# Patient Record
Sex: Male | Born: 1935 | Race: White | Hispanic: No | Marital: Married | Smoking: Former smoker
Health system: Southern US, Community
[De-identification: ages and names within clinical notes are randomized; demographics above are authoritative.]

## PROBLEM LIST (undated history)

## (undated) DIAGNOSIS — K648 Other hemorrhoids: Secondary | ICD-10-CM

## (undated) DIAGNOSIS — I10 Essential (primary) hypertension: Secondary | ICD-10-CM

## (undated) DIAGNOSIS — I2510 Atherosclerotic heart disease of native coronary artery without angina pectoris: Secondary | ICD-10-CM

## (undated) DIAGNOSIS — N189 Chronic kidney disease, unspecified: Secondary | ICD-10-CM

## (undated) DIAGNOSIS — J449 Chronic obstructive pulmonary disease, unspecified: Secondary | ICD-10-CM

## (undated) DIAGNOSIS — M1990 Unspecified osteoarthritis, unspecified site: Secondary | ICD-10-CM

## (undated) DIAGNOSIS — K5790 Diverticulosis of intestine, part unspecified, without perforation or abscess without bleeding: Secondary | ICD-10-CM

## (undated) DIAGNOSIS — N200 Calculus of kidney: Secondary | ICD-10-CM

## (undated) DIAGNOSIS — E785 Hyperlipidemia, unspecified: Secondary | ICD-10-CM

## (undated) HISTORY — DX: Unspecified osteoarthritis, unspecified site: M19.90

## (undated) HISTORY — PX: APPENDECTOMY: SHX54

## (undated) HISTORY — PX: TONSILLECTOMY AND ADENOIDECTOMY: SUR1326

## (undated) HISTORY — PX: ROTATOR CUFF REPAIR: SHX139

## (undated) HISTORY — PX: LEG SURGERY: SHX1003

## (undated) HISTORY — DX: Diverticulosis of intestine, part unspecified, without perforation or abscess without bleeding: K57.90

## (undated) HISTORY — DX: Atherosclerotic heart disease of native coronary artery without angina pectoris: I25.10

## (undated) HISTORY — DX: Other hemorrhoids: K64.8

## (undated) HISTORY — PX: INGUINAL HERNIA REPAIR: SUR1180

## (undated) HISTORY — PX: CHOLECYSTECTOMY: SHX55

## (undated) HISTORY — DX: Essential (primary) hypertension: I10

## (undated) HISTORY — PX: COLONOSCOPY: SHX174

## (undated) HISTORY — PX: TOTAL KNEE ARTHROPLASTY: SHX125

## (undated) HISTORY — DX: Hyperlipidemia, unspecified: E78.5

## (undated) HISTORY — PX: LUMBAR LAMINECTOMY: SHX95

## (undated) HISTORY — DX: Chronic obstructive pulmonary disease, unspecified: J44.9

## (undated) HISTORY — PX: ANKLE FUSION: SHX881

## (undated) HISTORY — DX: Calculus of kidney: N20.0

---

## 1999-03-14 ENCOUNTER — Ambulatory Visit (HOSPITAL_COMMUNITY): Admission: RE | Admit: 1999-03-14 | Discharge: 1999-03-14 | Payer: Self-pay | Admitting: Internal Medicine

## 1999-09-01 HISTORY — PX: CORONARY ARTERY BYPASS GRAFT: SHX141

## 2000-05-12 ENCOUNTER — Encounter: Admission: RE | Admit: 2000-05-12 | Discharge: 2000-05-12 | Payer: Self-pay | Admitting: Orthopedic Surgery

## 2000-05-12 ENCOUNTER — Encounter: Payer: Self-pay | Admitting: Orthopedic Surgery

## 2000-05-13 ENCOUNTER — Ambulatory Visit (HOSPITAL_BASED_OUTPATIENT_CLINIC_OR_DEPARTMENT_OTHER): Admission: RE | Admit: 2000-05-13 | Discharge: 2000-05-13 | Payer: Self-pay | Admitting: Orthopedic Surgery

## 2000-05-25 ENCOUNTER — Encounter: Admission: RE | Admit: 2000-05-25 | Discharge: 2000-06-14 | Payer: Self-pay | Admitting: Orthopedic Surgery

## 2004-10-06 ENCOUNTER — Ambulatory Visit: Payer: Self-pay | Admitting: Internal Medicine

## 2004-10-21 ENCOUNTER — Encounter: Admission: RE | Admit: 2004-10-21 | Discharge: 2004-10-21 | Payer: Self-pay | Admitting: Orthopedic Surgery

## 2004-10-22 ENCOUNTER — Ambulatory Visit (HOSPITAL_COMMUNITY): Admission: RE | Admit: 2004-10-22 | Discharge: 2004-10-22 | Payer: Self-pay | Admitting: Orthopedic Surgery

## 2004-10-22 ENCOUNTER — Ambulatory Visit (HOSPITAL_BASED_OUTPATIENT_CLINIC_OR_DEPARTMENT_OTHER): Admission: RE | Admit: 2004-10-22 | Discharge: 2004-10-22 | Payer: Self-pay | Admitting: Orthopedic Surgery

## 2004-11-03 ENCOUNTER — Encounter: Admission: RE | Admit: 2004-11-03 | Discharge: 2005-02-01 | Payer: Self-pay | Admitting: Orthopedic Surgery

## 2005-02-18 ENCOUNTER — Ambulatory Visit: Payer: Self-pay | Admitting: Internal Medicine

## 2005-03-06 ENCOUNTER — Ambulatory Visit: Payer: Self-pay | Admitting: Internal Medicine

## 2005-03-06 ENCOUNTER — Encounter (INDEPENDENT_AMBULATORY_CARE_PROVIDER_SITE_OTHER): Payer: Self-pay | Admitting: Specialist

## 2005-12-22 ENCOUNTER — Encounter: Admission: RE | Admit: 2005-12-22 | Discharge: 2006-03-22 | Payer: Self-pay | Admitting: Internal Medicine

## 2006-09-20 ENCOUNTER — Encounter: Admission: RE | Admit: 2006-09-20 | Discharge: 2006-10-19 | Payer: Self-pay | Admitting: Neurosurgery

## 2006-10-20 ENCOUNTER — Encounter: Admission: RE | Admit: 2006-10-20 | Discharge: 2006-11-10 | Payer: Self-pay | Admitting: Neurosurgery

## 2009-10-21 ENCOUNTER — Ambulatory Visit (HOSPITAL_COMMUNITY): Admission: RE | Admit: 2009-10-21 | Discharge: 2009-10-21 | Payer: Self-pay | Admitting: Urology

## 2010-02-12 ENCOUNTER — Encounter (INDEPENDENT_AMBULATORY_CARE_PROVIDER_SITE_OTHER): Payer: Self-pay | Admitting: *Deleted

## 2010-04-17 ENCOUNTER — Encounter: Admission: RE | Admit: 2010-04-17 | Discharge: 2010-04-17 | Payer: Self-pay | Admitting: Orthopedic Surgery

## 2010-04-28 ENCOUNTER — Encounter: Admission: RE | Admit: 2010-04-28 | Discharge: 2010-04-28 | Payer: Self-pay | Admitting: Orthopedic Surgery

## 2010-09-21 ENCOUNTER — Encounter: Payer: Self-pay | Admitting: Orthopedic Surgery

## 2010-10-02 ENCOUNTER — Other Ambulatory Visit: Payer: Self-pay | Admitting: Urology

## 2010-10-02 DIAGNOSIS — N281 Cyst of kidney, acquired: Secondary | ICD-10-CM

## 2010-10-02 NOTE — Letter (Signed)
Summary: Colonoscopy Date Change Letter  [facility]  [address] [place], [place] [address]   Phone: [phone]  Fax: [phone]      February 12, 2010 MRN: [id number]   Kristopher Ayers [address] [place], [place]  [address]   Dear Mr. Cordoba,   Previously you were recommended to have a repeat colonoscopy around this time. Your chart was recently reviewed by Dr. Hedwig Morton. Juanda Chance of  Gastroenterology. Follow up colonoscopy is now recommended in July 2013. This revised recommendation is based on current, nationally recognized guidelines for colorectal cancer screening and polyp surveillance. These guidelines are endorsed by the American Cancer Society, [employer] on Colorectal Cancer as well as numerous other major medical organizations.  Please understand that our recommendation assumes that you do not have any new symptoms such as bleeding, a change in bowel habits, anemia, or significant abdominal discomfort. If you do have any concerning GI symptoms or want to discuss the guideline recommendations, please call to arrange an office visit at your earliest convenience. Otherwise we will keep you in our reminder system and contact you 1-2 months prior to the date listed above to schedule your next colonoscopy.  Thank you,  Hedwig Morton. Juanda Chance, M.D.  [facility] [phone]

## 2010-11-06 ENCOUNTER — Inpatient Hospital Stay (HOSPITAL_COMMUNITY): Admission: RE | Admit: 2010-11-06 | Payer: Self-pay | Source: Ambulatory Visit

## 2010-11-06 ENCOUNTER — Ambulatory Visit (HOSPITAL_COMMUNITY)
Admission: RE | Admit: 2010-11-06 | Discharge: 2010-11-06 | Disposition: A | Discharge status: Home / Self Care | Payer: Medicare Other | Source: Ambulatory Visit | Attending: Urology | Admitting: Urology

## 2010-11-06 DIAGNOSIS — Q618 Other cystic kidney diseases: Secondary | ICD-10-CM | POA: Insufficient documentation

## 2010-11-06 DIAGNOSIS — N289 Disorder of kidney and ureter, unspecified: Secondary | ICD-10-CM | POA: Insufficient documentation

## 2010-11-06 MED ORDER — GADOBENATE DIMEGLUMINE 529 MG/ML IV SOLN
20.0000 mL | Freq: Once | INTRAVENOUS | Status: AC | PRN
  Administered 2010-11-06: 20 mL via INTRAVENOUS

## 2011-01-16 NOTE — Op Note (Signed)
[place]  Patient:    Kristopher Ayers, Kristopher Ayers                     MRN: [id number] Proc. Date: 05/13/00 Adm. Date:  [id number] Attending:  Colbert Ewing                           Operative Report  PREOPERATIVE DIAGNOSES:  Chronic impingement right shoulder.  Chronic tearing long head biceps tendon.  Degenerative joint disease acromioclavicular joint. Labral tear with partial thickness tearing rotator cuff.  POSTOPERATIVE DIAGNOSIS:  Chronic impingement right shoulder.  Chronic tearing long head biceps tendon.  Degenerative joint disease acromioclavicular joint. Labral tear with partial thickness tearing rotator cuff without full-thickness cuff tears.  Partial tearing subscapularis tendon.  PROCEDURES:  Right shoulder examination under anesthesia, arthroscopy with debridement of labral tears, partial thickness subscapularis tear and undersurface tearing rotator cuff.  Arthroscopic acromioplasty, bursectomy, coracoacromial ligament release and distal clavicle excision.  SURGEON:  Loreta Ave, M.D.  ASSISTANT:  Arlys John D. Petrarca, P.A.-C.  ANESTHESIA:  General.  BLOOD LOSS:  Minimal.  SPECIMENS:  None.  CULTURES:  None.  COMPLICATIONS:  None.  DRESSING:  Soft compressive with sling.  PROCEDURE:  Patient was brought to the operating room, placed on the operating table in the supine position.  After adequate anesthesia had been obtained, right shoulder examined.  Full motion with good stability found.  Placed in a beach chair position on the shoulder positioner, prepped and draped in usual sterile fashion.  Three portals created standard arch shoulder portals, anterior, posterolateral.  Shoulder entered with blunt obturator, distended and inspected.  Some grade 2 changes glenohumeral joint debrided.  Extensive frayed tearing attritional type entire labrum debrided to a stable surface. No instability pattern.  Remnant of biceps  tendon long head at the top of the glenoid debrided so that it would not produce mechanical symptoms.  Partial thickness tearing subscap and undersurface of cuff all debrided to a stable surface.  All structures thoroughly assessed.  No full-thickness tears of cuff.  Cannula was redirected subacromially.  Typical findings of impingement. Type 2-3 acromion anteriorly.  Bursa resected, cuff debrided and assessed.  No full-thickness tears.  Acromioplasty converted to a type I acromion with shaver and high-speed bur.  Distal clavicle, which also contributed to impingement and had grade 4 changes was resected over the lateral cm. Adequacy of decompression, clavicle excision confirmed viewing from all portals.  Instruments and fluid removed.  Portals, shoulder and bursa injected with Marcaine.  Portals closed with 4-0 nylon.  Sterile compressive dressing applied with sling.  Anesthesia reversed, brought to recovery room.  Tolerated surgery well, no complications. DD:  05/13/00 TD:  05/14/00 Job: [id number]

## 2011-01-16 NOTE — Op Note (Signed)
NAME:  Kristopher Ayers, NIEMAN NO.:  [id number]   MEDICAL RECORD NO.:  [id number]          PATIENT TYPE:  AMB   LOCATION:  DSC                          FACILITY:  [facility]   PHYSICIAN:  Loreta Ave, M.D. DATE OF BIRTH:  13-Nov-1925   DATE OF PROCEDURE:  10/22/2004  DATE OF DISCHARGE:                                 OPERATIVE REPORT   PREOPERATIVE DIAGNOSIS:  Right shoulder traumatic rotator cuff tear,  interval tearing between supra and infraspinatus.  Status post previous  subacromial decompression.  Also degenerative joint disease and labral tear  interarticular right shoulder glenohumeral joint.   POSTOPERATIVE DIAGNOSIS:  Right shoulder traumatic rotator cuff tear,  interval tearing between supra and infraspinatus.  Status post previous  subacromial decompression.  Also degenerative joint disease and labral tear  interarticular right shoulder glenohumeral joint with extensive grade 3 and  4 chondral changes glenohumeral joint and circumferential labral tearing.   PROCEDURE:  1.  Right shoulder exam under anesthesia.  2.  Arthroscopy.  3.  Debridement of labrum and glenohumeral joint with chondroplasty removal      loose bodies.  4.  Debridement of rotator cuff from below.  5.  Assessment of the decompression.  6.  Open repair of interval tear with a running Ethibond suture.   SURGEON:  Loreta Ave, M.D.   ASSISTANT:  Zonia Kief, P.A.   ANESTHESIA:  General.   BLOOD LOSS:  Minimal.   SPECIMENS:  None.   CULTURES:  None.   COMPLICATIONS:  None.   DRESSINGS:  Soft compressive with shoulder immobilizer.   PROCEDURE:  The patient was brought to the operating room, placed on the  operative table int he supine position.  After adequate anesthesia had been  obtained, placed in a beach chair position and the shoulder positioned,  prepped and draped in the usual sterile fashion.  Three portals created.  Anterior, posterior and lateral. Arthroscope  introduced and the joint was  distended and inspected.  Marked grade 3 and 4 changes glenohumeral joint  with numeral chondral loose bodies debrided.  Bone-on-bone over about half  the joint both glenoid and humerus, degenerative in nature.  Circumferential  tearing labrum debrided back to a stable surface.  Chronic ruptured long  head biceps tendon which was absent.  Marked partial tearing undersurface  rotator cuff infra and supraspinatus all debrided.  Although you could  palpate the interval tear between the infra and supraspinatus with a probe  from above, there was not a rent through the capsule below.  After thorough  debridement of glenohumeral joint, the cannula redirected subacromially.  Adequate bony decompression confirmed.  Roofing on the top of the entire  cuff all was debrided.  Interval tear going just about all the way out to  the lateral margin between the infra and supraspinatus, not through the  capsule.  After confirming adequate decompression, doing a bursectomy and  debriding the cuff, the instruments and fluid were removed.  Deltoid  splitting incision laterally.  The interval tear exposed, roughened to good  bleeding tissue and then closed with a running Ethibond #  2 suture x2.  Next,  performed side-to-side closure, ensuring good watertight closure of the cuff  and restoring the normal tension of the cuff.  Full passive motion without  any tension.  Wound irrigated.  Subcutaneous, subcuticular closure of the  incision with Vicryl and portal was closed with nylon.  A sterile  compressive dressing applied.  Splint applied.  Anesthesia reversed.  Brought to the recovery room.  Tolerated the surgery well, no complications.      DFM/MEDQ  D:  10/22/2004  T:  10/22/2004  Job:  [id number]

## 2011-12-15 ENCOUNTER — Encounter: Payer: Self-pay | Admitting: Internal Medicine

## 2011-12-30 ENCOUNTER — Encounter: Payer: Self-pay | Admitting: Internal Medicine

## 2012-02-18 ENCOUNTER — Encounter: Payer: Self-pay | Admitting: *Deleted

## 2012-03-08 ENCOUNTER — Encounter: Payer: Self-pay | Admitting: Internal Medicine

## 2012-03-08 ENCOUNTER — Ambulatory Visit (INDEPENDENT_AMBULATORY_CARE_PROVIDER_SITE_OTHER): Payer: Medicare Other | Admitting: Internal Medicine

## 2012-03-08 VITALS — BP 134/76 | HR 60 | Ht 66.0 in | Wt 210.0 lb

## 2012-03-08 DIAGNOSIS — R198 Other specified symptoms and signs involving the digestive system and abdomen: Secondary | ICD-10-CM

## 2012-03-08 DIAGNOSIS — Z1211 Encounter for screening for malignant neoplasm of colon: Secondary | ICD-10-CM

## 2012-03-08 DIAGNOSIS — R195 Other fecal abnormalities: Secondary | ICD-10-CM

## 2012-03-08 DIAGNOSIS — K5730 Diverticulosis of large intestine without perforation or abscess without bleeding: Secondary | ICD-10-CM

## 2012-03-08 MED ORDER — MOVIPREP 100 G PO SOLR: ORAL | Status: DC

## 2012-03-08 NOTE — Progress Notes (Signed)
Kristopher Ayers 1925-09-02 MRN [id number]   History of Present Illness:  This is an 78 year old white male with change in bowel habits. He has incomplete evacuation and constipation. He has not had a bowel movement for 3 days. He uses apple juice and has tried Metamucil without results. Patient had a screening colonoscopy in July 2006 which showed a polyp and diverticulosis. The pathology of the polyp was polypoid mucosa. He denies rectal bleeding or family history of colon cancer. He has a history of a renal cyst which is followed by MRI. He is due for a recall colonoscopy.   Past Medical History  Diagnosis Date  . DJD (degenerative joint disease)   . Hypertension   . Hyperlipidemia   . Kidney stone   . Diverticulosis   . Internal hemorrhoids   . COPD (chronic obstructive pulmonary disease)    Past Surgical History  Procedure Date  . Ankle fusion   . Rotator cuff repair     x 2  . Total knee arthroplasty     left  . Lumbar laminectomy   . Coronary artery bypass graft   . Tonsillectomy and adenoidectomy   . Inguinal hernia repair   . Leg surgery     reports that he has quit smoking. He has never used smokeless tobacco. He reports that he does not drink alcohol or use illicit drugs. family history is negative for Colon cancer. Allergies  Allergen Reactions  . Morphine And Related         Review of Systems: Denies heartburn, dysphagia, odynophagia chest pain  The remainder of the 10 point ROS is negative except as outlined in H&P   Physical Exam: General appearance  Well developed, in no distress. Eyes- non icteric. HEENT nontraumatic, normocephalic. Mouth no lesions, tongue papillated, no cheilosis. Neck supple without adenopathy, thyroid not enlarged, no carotid bruits, no JVD. Lungs Clear to auscultation bilaterally. Cor normal S1, normal S2, regular rhythm, no murmur,  quiet precordium. Abdomen: Soft relaxed with normal active bowel sounds. No distention. Liver  edge at costal margin. No bruit, no palpable mass. Rectal: Slightly decreased rectal sphincter tone. Moderate amount of soft mushy Hemoccult negative stool. Extremities 2+ pedal edema. Skin no lesions. Neurological alert and oriented x 3. Psychological normal mood and affect.  Assessment and Plan:  Problem #1 Recent change in bowel habits in an 78 year old gentleman who is otherwise in good health. He has mobility problems due to swelling of his feet. He has a history of diverticulosis and his constipation may be due to progressive diverticular disease, decreased mobility and some medications; specifically his antihypertensive medications. He is Hemoccult negative. We will start him on Senokot 1-2 tablets at bedtime and I advised him to continue a high fiber diet. We will schedule a colonoscopy to rule out colon obstruction.   03/08/2012 Lina Sar

## 2012-03-08 NOTE — Patient Instructions (Addendum)
You have been scheduled for a colonoscopy with propofol. Please follow written instructions given to you at your visit today.  Please pick up your prep kit at the pharmacy within the next 1-3 days. Please make certain to bring all inhalers that you use with you on the day of your procedure. Please purchase Senokot over the counter. Take 1-2 tablets every bedtime. CC: Dr Synetta Fail

## 2012-03-29 ENCOUNTER — Encounter: Payer: Self-pay | Admitting: *Deleted

## 2012-03-29 NOTE — Telephone Encounter (Signed)
Entered in error

## 2012-03-30 ENCOUNTER — Encounter: Payer: Self-pay | Admitting: Internal Medicine

## 2012-03-30 ENCOUNTER — Ambulatory Visit (AMBULATORY_SURGERY_CENTER): Payer: Medicare Other | Admitting: Internal Medicine

## 2012-03-30 VITALS — BP 137/65 | HR 57 | Temp 98.7°F | Resp 15 | Ht 66.0 in | Wt 210.0 lb

## 2012-03-30 DIAGNOSIS — Z1211 Encounter for screening for malignant neoplasm of colon: Secondary | ICD-10-CM

## 2012-03-30 DIAGNOSIS — D126 Benign neoplasm of colon, unspecified: Secondary | ICD-10-CM

## 2012-03-30 MED ORDER — SODIUM CHLORIDE 0.9 % IV SOLN: 500.0000 mL | INTRAVENOUS | Status: DC

## 2012-03-30 NOTE — Op Note (Signed)
[facility] [address] [place], [place]  [address]  COLONOSCOPY PROCEDURE REPORT  PATIENT:  Kristopher, Ayers  MR#:  [id number] BIRTHDATE:  06-04-1926, 86 yrs. old  GENDER:  male ENDOSCOPIST:  Hedwig Morton. Juanda Chance, MD REF. BY:  Synetta Fail, M.D. PROCEDURE DATE:  03/30/2012 PROCEDURE:  Colonoscopy with biopsy ASA CLASS:  Class II INDICATIONS:  change in bowel habits last colon 2006 MEDICATIONS:   MAC sedation, administered by CRNA, propofol (Diprivan) 100 mg  DESCRIPTION OF PROCEDURE:   After the risks and benefits and of the procedure were explained, informed consent was obtained. Digital rectal exam was performed and revealed no rectal masses. The LB CF-[id number] endoscope was introduced through the anus and advanced to the cecum, which was identified by both the appendix and ileocecal valve.  The quality of the prep was Moviprep fair.  The instrument was then slowly withdrawn as the colon was fully examined. <<PROCEDUREIMAGES>>  FINDINGS:  Mild diverticulosis was found throughout the colon (see image6, image7, and image8).  A diminutive polyp was found. at 20 cm 5 mm polyp The polyp was removed using cold biopsy forceps (see image8).  This was otherwise a normal examination of the colon (see image1, image2, image3, image4, and image5).   Retroflexion was not performed.  The scope was then withdrawn from the patient and the procedure completed.  COMPLICATIONS:  None ENDOSCOPIC IMPRESSION: 1) Mild diverticulosis throughout the colon 2) Diminutive polyp 3) Otherwise normal examination change in bowl habits likely related to life style. will increase fiber, exercise,, may use OTC laxatives RECOMMENDATIONS: Colace 100mg  qd Miralax 9 mg 3x/week prn  REPEAT EXAM:  In 0 year(s) for.  ______________________________ Hedwig Morton. Juanda Chance, MD  CC:  n. eSIGNED:   Hedwig Morton. Glora Hulgan at 03/30/2012 04:40 PM  Faylene Kurtz, [phone]

## 2012-03-30 NOTE — Progress Notes (Signed)
Patient did not experience any of the following events: a burn prior to discharge; a fall within the facility; wrong site/side/patient/procedure/implant event; or a hospital transfer or hospital admission upon discharge from the facility. (G8907) Patient did not have preoperative order for IV antibiotic SSI prophylaxis. (G8918)  

## 2012-03-30 NOTE — Patient Instructions (Addendum)

## 2012-03-31 ENCOUNTER — Telehealth: Payer: Self-pay

## 2012-03-31 NOTE — Telephone Encounter (Signed)
Left message on answering machine. 

## 2012-04-05 ENCOUNTER — Encounter: Payer: Self-pay | Admitting: Internal Medicine

## 2012-08-16 ENCOUNTER — Other Ambulatory Visit (HOSPITAL_COMMUNITY): Payer: Self-pay | Admitting: Cardiovascular Disease

## 2012-08-16 DIAGNOSIS — I739 Peripheral vascular disease, unspecified: Secondary | ICD-10-CM

## 2013-06-02 ENCOUNTER — Encounter (HOSPITAL_COMMUNITY): Payer: Medicare Other

## 2014-04-10 ENCOUNTER — Encounter: Payer: Self-pay | Admitting: Internal Medicine

## 2015-09-04 DIAGNOSIS — R0602 Shortness of breath: Secondary | ICD-10-CM | POA: Diagnosis not present

## 2015-09-04 DIAGNOSIS — I2510 Atherosclerotic heart disease of native coronary artery without angina pectoris: Secondary | ICD-10-CM | POA: Diagnosis not present

## 2015-09-10 DIAGNOSIS — I2510 Atherosclerotic heart disease of native coronary artery without angina pectoris: Secondary | ICD-10-CM | POA: Diagnosis not present

## 2015-09-10 DIAGNOSIS — N183 Chronic kidney disease, stage 3 (moderate): Secondary | ICD-10-CM | POA: Diagnosis not present

## 2015-09-10 DIAGNOSIS — I10 Essential (primary) hypertension: Secondary | ICD-10-CM | POA: Diagnosis not present

## 2015-09-10 DIAGNOSIS — E785 Hyperlipidemia, unspecified: Secondary | ICD-10-CM | POA: Diagnosis not present

## 2015-09-10 DIAGNOSIS — R9439 Abnormal result of other cardiovascular function study: Secondary | ICD-10-CM | POA: Diagnosis not present

## 2015-09-10 DIAGNOSIS — R0609 Other forms of dyspnea: Secondary | ICD-10-CM | POA: Diagnosis not present

## 2015-09-17 DIAGNOSIS — R9439 Abnormal result of other cardiovascular function study: Secondary | ICD-10-CM | POA: Diagnosis not present

## 2015-09-17 DIAGNOSIS — Z01818 Encounter for other preprocedural examination: Secondary | ICD-10-CM | POA: Diagnosis not present

## 2015-09-17 DIAGNOSIS — R0609 Other forms of dyspnea: Secondary | ICD-10-CM | POA: Diagnosis not present

## 2015-09-17 DIAGNOSIS — Z981 Arthrodesis status: Secondary | ICD-10-CM | POA: Diagnosis not present

## 2015-09-17 DIAGNOSIS — R0602 Shortness of breath: Secondary | ICD-10-CM | POA: Diagnosis not present

## 2015-09-17 DIAGNOSIS — R918 Other nonspecific abnormal finding of lung field: Secondary | ICD-10-CM | POA: Diagnosis not present

## 2015-09-17 DIAGNOSIS — Z01810 Encounter for preprocedural cardiovascular examination: Secondary | ICD-10-CM | POA: Diagnosis not present

## 2015-09-18 DIAGNOSIS — I2510 Atherosclerotic heart disease of native coronary artery without angina pectoris: Secondary | ICD-10-CM | POA: Diagnosis not present

## 2015-09-18 DIAGNOSIS — Z7982 Long term (current) use of aspirin: Secondary | ICD-10-CM | POA: Diagnosis not present

## 2015-09-18 DIAGNOSIS — I444 Left anterior fascicular block: Secondary | ICD-10-CM | POA: Diagnosis not present

## 2015-09-18 DIAGNOSIS — I25718 Atherosclerosis of autologous vein coronary artery bypass graft(s) with other forms of angina pectoris: Secondary | ICD-10-CM | POA: Diagnosis not present

## 2015-09-18 DIAGNOSIS — R001 Bradycardia, unspecified: Secondary | ICD-10-CM | POA: Diagnosis not present

## 2015-09-18 DIAGNOSIS — I25118 Atherosclerotic heart disease of native coronary artery with other forms of angina pectoris: Secondary | ICD-10-CM | POA: Diagnosis not present

## 2015-09-18 DIAGNOSIS — R0609 Other forms of dyspnea: Secondary | ICD-10-CM | POA: Diagnosis not present

## 2015-09-18 DIAGNOSIS — N189 Chronic kidney disease, unspecified: Secondary | ICD-10-CM | POA: Diagnosis not present

## 2015-09-18 DIAGNOSIS — J449 Chronic obstructive pulmonary disease, unspecified: Secondary | ICD-10-CM | POA: Diagnosis not present

## 2015-09-18 DIAGNOSIS — Z951 Presence of aortocoronary bypass graft: Secondary | ICD-10-CM | POA: Diagnosis not present

## 2015-09-18 DIAGNOSIS — Z79899 Other long term (current) drug therapy: Secondary | ICD-10-CM | POA: Diagnosis not present

## 2015-09-18 DIAGNOSIS — I129 Hypertensive chronic kidney disease with stage 1 through stage 4 chronic kidney disease, or unspecified chronic kidney disease: Secondary | ICD-10-CM | POA: Diagnosis not present

## 2015-09-18 DIAGNOSIS — I081 Rheumatic disorders of both mitral and tricuspid valves: Secondary | ICD-10-CM | POA: Diagnosis not present

## 2015-09-18 DIAGNOSIS — I440 Atrioventricular block, first degree: Secondary | ICD-10-CM | POA: Diagnosis not present

## 2015-09-18 DIAGNOSIS — I358 Other nonrheumatic aortic valve disorders: Secondary | ICD-10-CM | POA: Diagnosis not present

## 2015-09-18 DIAGNOSIS — R931 Abnormal findings on diagnostic imaging of heart and coronary circulation: Secondary | ICD-10-CM | POA: Diagnosis not present

## 2015-09-18 DIAGNOSIS — I491 Atrial premature depolarization: Secondary | ICD-10-CM | POA: Diagnosis not present

## 2015-09-18 DIAGNOSIS — R0600 Dyspnea, unspecified: Secondary | ICD-10-CM | POA: Diagnosis not present

## 2015-09-18 DIAGNOSIS — I517 Cardiomegaly: Secondary | ICD-10-CM | POA: Diagnosis not present

## 2015-09-19 DIAGNOSIS — I2510 Atherosclerotic heart disease of native coronary artery without angina pectoris: Secondary | ICD-10-CM | POA: Diagnosis not present

## 2015-09-19 DIAGNOSIS — J449 Chronic obstructive pulmonary disease, unspecified: Secondary | ICD-10-CM | POA: Diagnosis not present

## 2015-10-01 DIAGNOSIS — Z029 Encounter for administrative examinations, unspecified: Secondary | ICD-10-CM | POA: Diagnosis not present

## 2015-10-19 DIAGNOSIS — I25110 Atherosclerotic heart disease of native coronary artery with unstable angina pectoris: Secondary | ICD-10-CM | POA: Diagnosis not present

## 2015-10-19 DIAGNOSIS — E785 Hyperlipidemia, unspecified: Secondary | ICD-10-CM | POA: Diagnosis not present

## 2015-10-19 DIAGNOSIS — I10 Essential (primary) hypertension: Secondary | ICD-10-CM | POA: Diagnosis not present

## 2015-10-22 DIAGNOSIS — I10 Essential (primary) hypertension: Secondary | ICD-10-CM | POA: Diagnosis not present

## 2015-10-22 DIAGNOSIS — I2510 Atherosclerotic heart disease of native coronary artery without angina pectoris: Secondary | ICD-10-CM | POA: Diagnosis not present

## 2015-10-22 DIAGNOSIS — I358 Other nonrheumatic aortic valve disorders: Secondary | ICD-10-CM | POA: Diagnosis not present

## 2015-10-28 DIAGNOSIS — H353132 Nonexudative age-related macular degeneration, bilateral, intermediate dry stage: Secondary | ICD-10-CM | POA: Diagnosis not present

## 2015-11-05 DIAGNOSIS — M19011 Primary osteoarthritis, right shoulder: Secondary | ICD-10-CM | POA: Diagnosis not present

## 2015-12-16 DIAGNOSIS — F320 Major depressive disorder, single episode, mild: Secondary | ICD-10-CM | POA: Diagnosis not present

## 2015-12-16 DIAGNOSIS — R0609 Other forms of dyspnea: Secondary | ICD-10-CM | POA: Diagnosis not present

## 2015-12-16 DIAGNOSIS — R5383 Other fatigue: Secondary | ICD-10-CM | POA: Diagnosis not present

## 2015-12-16 DIAGNOSIS — J449 Chronic obstructive pulmonary disease, unspecified: Secondary | ICD-10-CM | POA: Diagnosis not present

## 2015-12-16 DIAGNOSIS — R0602 Shortness of breath: Secondary | ICD-10-CM | POA: Diagnosis not present

## 2015-12-16 DIAGNOSIS — Z951 Presence of aortocoronary bypass graft: Secondary | ICD-10-CM | POA: Diagnosis not present

## 2015-12-16 DIAGNOSIS — M19011 Primary osteoarthritis, right shoulder: Secondary | ICD-10-CM | POA: Diagnosis not present

## 2016-01-02 DIAGNOSIS — H903 Sensorineural hearing loss, bilateral: Secondary | ICD-10-CM | POA: Diagnosis not present

## 2016-01-14 DIAGNOSIS — M19011 Primary osteoarthritis, right shoulder: Secondary | ICD-10-CM | POA: Diagnosis not present

## 2016-01-15 DIAGNOSIS — I2510 Atherosclerotic heart disease of native coronary artery without angina pectoris: Secondary | ICD-10-CM | POA: Diagnosis not present

## 2016-01-15 DIAGNOSIS — E785 Hyperlipidemia, unspecified: Secondary | ICD-10-CM | POA: Diagnosis not present

## 2016-01-15 DIAGNOSIS — I10 Essential (primary) hypertension: Secondary | ICD-10-CM | POA: Diagnosis not present

## 2016-01-15 DIAGNOSIS — R0609 Other forms of dyspnea: Secondary | ICD-10-CM | POA: Diagnosis not present

## 2016-01-28 DIAGNOSIS — M1000 Idiopathic gout, unspecified site: Secondary | ICD-10-CM | POA: Diagnosis not present

## 2016-01-28 DIAGNOSIS — H6691 Otitis media, unspecified, right ear: Secondary | ICD-10-CM | POA: Diagnosis not present

## 2016-01-30 DIAGNOSIS — H9201 Otalgia, right ear: Secondary | ICD-10-CM | POA: Diagnosis not present

## 2016-01-30 DIAGNOSIS — H66001 Acute suppurative otitis media without spontaneous rupture of ear drum, right ear: Secondary | ICD-10-CM | POA: Diagnosis not present

## 2016-01-31 ENCOUNTER — Telehealth: Payer: Self-pay | Admitting: Cardiology

## 2016-01-31 NOTE — Telephone Encounter (Signed)
Faxed request to [facility] [place] [facility] to obtain records for patient's appointment on 02/24/16 with Dr Martinique.  Faxed on 01/31/16. lp

## 2016-02-04 ENCOUNTER — Telehealth: Payer: Self-pay | Admitting: Cardiology

## 2016-02-04 NOTE — Telephone Encounter (Signed)
Received records from [facility] as requested for appointment with Dr Martinique 02/24/16.  Records given to [facility] (medical records) for Dr Doug Sou schedule on 02/24/16. lp

## 2016-02-05 DIAGNOSIS — H60311 Diffuse otitis externa, right ear: Secondary | ICD-10-CM | POA: Diagnosis not present

## 2016-02-11 DIAGNOSIS — H60501 Unspecified acute noninfective otitis externa, right ear: Secondary | ICD-10-CM | POA: Diagnosis not present

## 2016-02-11 DIAGNOSIS — H6001 Abscess of right external ear: Secondary | ICD-10-CM | POA: Diagnosis not present

## 2016-02-18 DIAGNOSIS — H6001 Abscess of right external ear: Secondary | ICD-10-CM | POA: Diagnosis not present

## 2016-02-18 DIAGNOSIS — H6000 Abscess of external ear, unspecified ear: Secondary | ICD-10-CM | POA: Diagnosis not present

## 2016-02-24 ENCOUNTER — Ambulatory Visit (INDEPENDENT_AMBULATORY_CARE_PROVIDER_SITE_OTHER): Payer: PPO | Admitting: Cardiology

## 2016-02-24 ENCOUNTER — Encounter: Payer: Self-pay | Admitting: Cardiology

## 2016-02-24 VITALS — BP 149/76 | HR 64 | Ht 66.0 in | Wt 214.6 lb

## 2016-02-24 DIAGNOSIS — I25709 Atherosclerosis of coronary artery bypass graft(s), unspecified, with unspecified angina pectoris: Secondary | ICD-10-CM

## 2016-02-24 DIAGNOSIS — E785 Hyperlipidemia, unspecified: Secondary | ICD-10-CM | POA: Diagnosis not present

## 2016-02-24 DIAGNOSIS — I2510 Atherosclerotic heart disease of native coronary artery without angina pectoris: Secondary | ICD-10-CM | POA: Insufficient documentation

## 2016-02-24 DIAGNOSIS — I10 Essential (primary) hypertension: Secondary | ICD-10-CM

## 2016-02-24 NOTE — Patient Instructions (Signed)
Continue your current therapy  We will request a copy of your cath films from [facility].  I will see you in 6 months.

## 2016-02-24 NOTE — Progress Notes (Signed)
Cardiology Office Note    Date:  02/24/2016   ID:  Kristopher Ayers, DOB Jan 13, 1926, MRN [id number]  PCP:  Kristopher Coma., MD  Cardiologist:  Peter Martinique, MD    History of Present Illness:  Kristopher Ayers is a 82 y.o. male seen at the request of Dr. Veverly Fells for cardiac evaluation. The patient is being considered for shoulder replacement surgery. He is a former patient of Dr. Baxter Hire in [facility]. He has a history of CAD and is s/p CABG in 2001 by Dr. Jerelene Redden. This included LIMA to the LAD, SVG to Ramus, and SVG to RCA. He did well until the end of 2016 when he developed progressive dyspnea on exertion. A stress test was abnormal and this led to a cardiac cath. This showed his grafts were all patent but the LCX and OM1 had significant disease and were not previously grafted. The LCx was stented with a 3.0 x 15 mm Xience stent and the OM with a 2.75 x 15 mm Xience stent. Echo at that time showed normal LV function with EF 50-55%. There was aortic valve sclerosis without stenosis. He has a history of remote tobacco use 50 years ago, HTN, and hyperlipidemia. Also family history of CAD.  Since January he did note some improvement in dyspnea but still gets SOB with activity. His activity is very limited due to chronic leg weakness and pain. He does very little walking. Vascular doppler studies have been ok. He has 3 prior back operations. He does have a lot of shoulder pain and is s/p rotator cuff surgery x 2. He denies any orthopnea, PND, edema, palpitations, or syncope.  Past Medical History  Diagnosis Date  . DJD (degenerative joint disease)   . Hypertension   . Hyperlipidemia   . Kidney stone   . Diverticulosis   . Internal hemorrhoids   . COPD (chronic obstructive pulmonary disease) ([place])   . CAD (coronary artery disease)     Past Surgical History  Procedure Laterality Date  . Ankle fusion    . Rotator cuff repair      x 2  . Total knee arthroplasty      left  . Lumbar  laminectomy      x3  . Coronary artery bypass graft    . Tonsillectomy and adenoidectomy    . Inguinal hernia repair    . Leg surgery      Current Medications:   Medication List       This list is accurate as of: 02/24/16 12:58 PM.  Always use your most recent med list.               acetaminophen 325 MG tablet  Commonly known as:  TYLENOL  Take 650 mg by mouth.     allopurinol 100 MG tablet  Commonly known as:  ZYLOPRIM  Take 100 mg by mouth daily.     ANDROGEL PUMP 20.25 MG/ACT (1.62%) Gel  Generic drug:  Testosterone     aspirin 81 MG tablet  Take 81 mg by mouth daily.     chlorthalidone 25 MG tablet  Commonly known as:  HYGROTON  Take 12.5 mg by mouth.     clopidogrel 75 MG tablet  Commonly known as:  PLAVIX  Take 75 mg by mouth.     febuxostat 40 MG tablet  Commonly known as:  ULORIC  Take 40 mg by mouth.     fluticasone furoate-vilanterol 100-25 MCG/INH Aepb  Commonly known  as:  BREO ELLIPTA     losartan 50 MG tablet  Commonly known as:  COZAAR  Takes as directed     metoprolol succinate 25 MG 24 hr tablet  Commonly known as:  TOPROL-XL  Take 25 mg by mouth.     pravastatin 40 MG tablet  Commonly known as:  PRAVACHOL  Take 40 mg by mouth.     PROAIR HFA 108 (90 Base) MCG/ACT inhaler  Generic drug:  albuterol  Uses as directed     SPIRIVA HANDIHALER 18 MCG inhalation capsule  Generic drug:  tiotropium  Uses as directed     valsartan 80 MG tablet  Commonly known as:  DIOVAN     VITAMIN D-1000 MAX ST 1000 units tablet  Generic drug:  Cholecalciferol  Take by mouth.         Allergies:   Amlodipine; Buprenorphine hcl; Diazepam; Irbesartan; Morphine and related; Hydrocodone-acetaminophen; and Nsaids   Social History   Social History  . Marital Status: Married    Spouse Name: N/A  . Number of Children: 3  . Years of Education: N/A   Occupational History  . Retired [employer]   Social History Main Topics  . Smoking status:  Former Research scientist (life sciences)  . Smokeless tobacco: Never Used  . Alcohol Use: No  . Drug Use: No  . Sexual Activity: Not Asked   Other Topics Concern  . None   Social History Narrative   Daily caffeine      Family History:  The patient's family history includes CAD in his brother; Congestive Heart Failure in his father. There is no history of Colon cancer.   ROS:   Please see the history of present illness.    ROS All other systems reviewed and are negative.   PHYSICAL EXAM:   VS:  BP 149/76 mmHg  Pulse 64  Ht 5\' 6"  (1.676 m)  Wt 214 lb 9.6 oz (97.342 kg)  BMI 34.65 kg/m2   GEN: Well nourished, obese, in no acute distress HEENT: normal Neck: no JVD, carotid bruits, or masses Cardiac: RRR; normal S1-2, no gallop or rub. Gr 2/6 systolic ejection murmur RUSB.  Respiratory:  clear to auscultation bilaterally, normal work of breathing GI: soft, nontender, nondistended, + BS MS: no deformity or atrophy Skin: warm and dry, no rash Neuro:  Alert and Oriented x 3, Strength and sensation are intact Psych: euthymic mood, full affect  Wt Readings from Last 3 Encounters:  02/24/16 214 lb 9.6 oz (97.342 kg)  03/30/12 210 lb (95.255 kg)  03/08/12 210 lb (95.255 kg)      Studies/Labs Reviewed:   EKG:  EKG is ordered today.  The ekg ordered today demonstrates NSR with first degree AV block. LAD, LVH with repolarization abnormality. I have personally reviewed and interpreted this study.   Recent Labs: No results found for requested labs within last 365 days.   Lipid Panel No results found for: CHOL, TRIG, HDL, CHOLHDL, VLDL, LDLCALC, LDLDIRECT  Additional studies/ records that were reviewed today include:  Records from [facility] Lipid panel in Dec. 2016 show cholesterol 149, trig- 115, HDL 45, LDL 81.  CBC and CMET normal in April 2017.      ASSESSMENT:    1. Essential hypertension   2. Hyperlipidemia   3. Coronary artery disease involving coronary  bypass graft of native heart with unspecified angina pectoris      PLAN:  In order of problems listed above:  1.  HTN currently well controlled. 2. On statin therapy with good control. Patient reports lab work repeated yesterday. Results pending. 3. CAD s/p CABG 2001. S/p Stenting of the LCx and OM1 in Jan 2017 with DES. On DAPT with ASA and  Plavix. All bypass grafts still patent. With DES would recommend he complete one year of DAPT before elective surgery. At that point Plavix can be stopped and continued on ASA only. I will follow up in 6 months and consider clearing for surgery at that time. I have requested a copy of his cath films from [facility] to review.     Medication Adjustments/Labs and Tests Ordered: Current medicines are reviewed at length with the patient today.  Concerns regarding medicines are outlined above.  Medication changes, Labs and Tests ordered today are listed in the Patient Instructions below. Patient Instructions  Continue your current therapy  We will request a copy of your cath films from [facility].  I will see you in 6 months.        Signed, Peter Martinique, MD  02/24/2016 12:58 PM    [facility] [address], [place], [place], [address] [phone]

## 2016-03-02 ENCOUNTER — Ambulatory Visit: Payer: PPO | Admitting: Gastroenterology

## 2016-03-04 DIAGNOSIS — H6000 Abscess of external ear, unspecified ear: Secondary | ICD-10-CM | POA: Diagnosis not present

## 2016-03-10 DIAGNOSIS — M19011 Primary osteoarthritis, right shoulder: Secondary | ICD-10-CM | POA: Diagnosis not present

## 2016-03-18 DIAGNOSIS — E291 Testicular hypofunction: Secondary | ICD-10-CM | POA: Diagnosis not present

## 2016-03-18 DIAGNOSIS — R351 Nocturia: Secondary | ICD-10-CM | POA: Diagnosis not present

## 2016-03-18 DIAGNOSIS — N401 Enlarged prostate with lower urinary tract symptoms: Secondary | ICD-10-CM | POA: Diagnosis not present

## 2016-04-03 DIAGNOSIS — S80811A Abrasion, right lower leg, initial encounter: Secondary | ICD-10-CM | POA: Diagnosis not present

## 2016-04-13 DIAGNOSIS — S80811D Abrasion, right lower leg, subsequent encounter: Secondary | ICD-10-CM | POA: Diagnosis not present

## 2016-04-13 DIAGNOSIS — L03115 Cellulitis of right lower limb: Secondary | ICD-10-CM | POA: Diagnosis not present

## 2016-04-24 DIAGNOSIS — L03115 Cellulitis of right lower limb: Secondary | ICD-10-CM | POA: Diagnosis not present

## 2016-04-24 DIAGNOSIS — S80811D Abrasion, right lower leg, subsequent encounter: Secondary | ICD-10-CM | POA: Diagnosis not present

## 2016-04-27 DIAGNOSIS — H353132 Nonexudative age-related macular degeneration, bilateral, intermediate dry stage: Secondary | ICD-10-CM | POA: Diagnosis not present

## 2016-05-11 DIAGNOSIS — Z23 Encounter for immunization: Secondary | ICD-10-CM | POA: Diagnosis not present

## 2016-05-12 DIAGNOSIS — M19011 Primary osteoarthritis, right shoulder: Secondary | ICD-10-CM | POA: Diagnosis not present

## 2016-05-20 DIAGNOSIS — N183 Chronic kidney disease, stage 3 (moderate): Secondary | ICD-10-CM | POA: Diagnosis not present

## 2016-05-25 DIAGNOSIS — N183 Chronic kidney disease, stage 3 (moderate): Secondary | ICD-10-CM | POA: Diagnosis not present

## 2016-05-25 DIAGNOSIS — I10 Essential (primary) hypertension: Secondary | ICD-10-CM | POA: Diagnosis not present

## 2016-05-25 DIAGNOSIS — Z6834 Body mass index (BMI) 34.0-34.9, adult: Secondary | ICD-10-CM | POA: Diagnosis not present

## 2016-05-25 DIAGNOSIS — N281 Cyst of kidney, acquired: Secondary | ICD-10-CM | POA: Diagnosis not present

## 2016-05-29 DIAGNOSIS — M4690 Unspecified inflammatory spondylopathy, site unspecified: Secondary | ICD-10-CM | POA: Diagnosis not present

## 2016-06-29 DIAGNOSIS — E559 Vitamin D deficiency, unspecified: Secondary | ICD-10-CM | POA: Diagnosis not present

## 2016-06-29 DIAGNOSIS — I2510 Atherosclerotic heart disease of native coronary artery without angina pectoris: Secondary | ICD-10-CM | POA: Diagnosis not present

## 2016-06-29 DIAGNOSIS — M1000 Idiopathic gout, unspecified site: Secondary | ICD-10-CM | POA: Diagnosis not present

## 2016-06-29 DIAGNOSIS — D696 Thrombocytopenia, unspecified: Secondary | ICD-10-CM | POA: Diagnosis not present

## 2016-06-29 DIAGNOSIS — I10 Essential (primary) hypertension: Secondary | ICD-10-CM | POA: Diagnosis not present

## 2016-06-29 DIAGNOSIS — N183 Chronic kidney disease, stage 3 (moderate): Secondary | ICD-10-CM | POA: Diagnosis not present

## 2016-06-29 DIAGNOSIS — E785 Hyperlipidemia, unspecified: Secondary | ICD-10-CM | POA: Diagnosis not present

## 2016-07-14 DIAGNOSIS — M19011 Primary osteoarthritis, right shoulder: Secondary | ICD-10-CM | POA: Diagnosis not present

## 2016-10-08 NOTE — Progress Notes (Addendum)
Cardiology Office Note    Date:  10/09/2016   ID:  ITALO LAFARY, DOB 1926-01-20, MRN [id number]  PCP:  Lilian Coma., MD  Cardiologist:  Peter Martinique, MD    History of Present Illness:  Kristopher Ayers is a 83 y.o. male seen for follow up CAD. He is a former patient of Dr. Baxter Hire in [facility]. He has a history of CAD and is s/p CABG in 2001 by Dr. Jerelene Redden. This included LIMA to the LAD, SVG to Ramus, and SVG to RCA. He did well until the end of 2016 when he developed progressive dyspnea on exertion. A stress test was abnormal and this led to a cardiac cath. This showed his grafts were all patent but the LCX and OM1 had significant disease and were not previously grafted. The LCx was stented with a 3.0 x 15 mm Xience stent and the OM with a 2.75 x 15 mm Xience stent. Echo at that time showed normal LV function with EF 50-55%. There was aortic valve sclerosis without stenosis. He has a history of remote tobacco use 50 years ago, COPD, HTN, and hyperlipidemia. Also family history of CAD.  Since his last visit he still has significant dyspnea on exertion. He states it isn't that bad and that his Flonase inhaler helps. His family states his breathing never go better after his stents and is still limited.  His activity is very limited due to chronic leg weakness and pain. He does very little walking. Uses a walker. He has some instability of his right knee. Vascular doppler studies have been ok. He has 3 prior back operations. He does have a lot of shoulder pain and is s/p rotator cuff surgery x 2. He is considering another shoulder surgery but now his wife needs hip surgery. He denies any orthopnea, PND, edema, palpitations, or syncope.  Past Medical History:  Diagnosis Date  . CAD (coronary artery disease)   . COPD (chronic obstructive pulmonary disease) ([place])   . Diverticulosis   . DJD (degenerative joint disease)   . Hyperlipidemia   . Hypertension   . Internal hemorrhoids   . Kidney  stone     Past Surgical History:  Procedure Laterality Date  . ANKLE FUSION    . CORONARY ARTERY BYPASS GRAFT    . INGUINAL HERNIA REPAIR    . LEG SURGERY    . LUMBAR LAMINECTOMY     x3  . ROTATOR CUFF REPAIR     x 2  . TONSILLECTOMY AND ADENOIDECTOMY    . TOTAL KNEE ARTHROPLASTY     left    Current Medications: Allergies as of 10/09/2016      Reactions   Amlodipine Swelling   Buprenorphine Hcl Other (See Comments)   Diazepam    SOLN   Irbesartan Other (See Comments)   Cough/weakness   Morphine And Related    Hydrocodone-acetaminophen Rash   Nsaids Rash   Renal insufficiency      Medication List       Accurate as of 10/09/16 12:23 PM. Always use your most recent med list.          acetaminophen 325 MG tablet Commonly known as:  TYLENOL Take 650 mg by mouth.   allopurinol 100 MG tablet Commonly known as:  ZYLOPRIM Take 100 mg by mouth daily.   aspirin 81 MG tablet Take 81 mg by mouth daily.   chlorthalidone 25 MG tablet Commonly known as:  HYGROTON Take 12.5 mg by  mouth.   clopidogrel 75 MG tablet Commonly known as:  PLAVIX Take 75 mg by mouth.   febuxostat 40 MG tablet Commonly known as:  ULORIC Take 40 mg by mouth.   fluticasone 50 MCG/ACT nasal spray Commonly known as:  FLONASE 1 spray by Each Nare route daily.   metoprolol succinate 25 MG 24 hr tablet Commonly known as:  TOPROL-XL Take 25 mg by mouth.   pravastatin 40 MG tablet Commonly known as:  PRAVACHOL Take 40 mg by mouth.   valsartan 80 MG tablet Commonly known as:  DIOVAN   VITAMIN D-1000 MAX ST 1000 units tablet Generic drug:  Cholecalciferol Take by mouth.        Allergies:   Amlodipine; Buprenorphine hcl; Diazepam; Irbesartan; Morphine and related; Hydrocodone-acetaminophen; and Nsaids   Social History   Social History  . Marital status: Married    Spouse name: N/A  . Number of children: 3  . Years of education: N/A   Occupational History  . Retired [employer]   Social History Main Topics  . Smoking status: Former Research scientist (life sciences)  . Smokeless tobacco: Never Used  . Alcohol use No  . Drug use: No  . Sexual activity: Not on file   Other Topics Concern  . Not on file   Social History Narrative   Daily caffeine      Family History:  The patient's family history includes CAD in his brother; Congestive Heart Failure in his father.   ROS:   Please see the history of present illness.    ROS All other systems reviewed and are negative.   PHYSICAL EXAM:   VS:  BP (!) 151/71   Pulse 60   Ht 5\' 6"  (1.676 m)   Wt 215 lb (97.5 kg)   BMI 34.70 kg/m    GEN: Well nourished, obese, in no acute distress  HEENT: normal  Neck: no JVD, carotid bruits, or masses Cardiac: RRR; normal S1-2, no gallop or rub. Gr 2/6 systolic ejection murmur RUSB. He has 1+ pretibial edema. Respiratory:  clear to auscultation bilaterally, normal work of breathing GI: soft, nontender, nondistended, + BS MS: no deformity or atrophy  Skin: warm and dry, no rash Neuro:  Alert and Oriented x 3, Strength and sensation are intact Psych: euthymic mood, full affect  Wt Readings from Last 3 Encounters:  10/09/16 215 lb (97.5 kg)  02/24/16 214 lb 9.6 oz (97.3 kg)  03/30/12 210 lb (95.3 kg)      Studies/Labs Reviewed:   EKG:  EKG is not ordered today.     Recent Labs: No results found for requested labs within last 8760 hours.   Lipid Panel No results found for: CHOL, TRIG, HDL, CHOLHDL, VLDL, LDLCALC, LDLDIRECT  Additional studies/ records that were reviewed today include:  Records from [facility] via [facility] everywhere Lipid panel in Dec. 2016 show cholesterol 149, trig- 115, HDL 45, LDL 81.  CBC and CMET normal in April 2017.      ASSESSMENT:    1. Essential hypertension   2. Pure hypercholesterolemia   3. Coronary artery disease of bypass graft of native heart with stable angina pectoris ([place])   4. Chronic obstructive pulmonary disease, unspecified  COPD type ([place])      PLAN:  In order of problems listed above:  1. HTN currently well controlled. 2. On statin therapy with good control. 3. CAD s/p CABG 2001. S/p Stenting of the LCx and OM1 in Jan 2017 with DES. On  DAPT with ASA and  Plavix. All bypass grafts still patent. Plavix can be stopped now and continued on ASA only.  I have requested a copy of his cath films from [facility] to review- did not receive after last visit. I explained he is at least moderate risk for general anesthesia given age, poor functional status, and co-morbidities. He may still consider shoulder surgery to help with pain control and quality of life but he will need to consider risk.  4. Dyspnea. I suspect this is more related to COPD. Will add Spiriva daily. If no improvement would follow up with primary care to consider alternative Rx.     Medication Adjustments/Labs and Tests Ordered: Current medicines are reviewed at length with the patient today.  Concerns regarding medicines are outlined above.  Medication changes, Labs and Tests ordered today are listed in the Patient Instructions below. Patient Instructions  You may stop Plavix now.   Add Spiriva inhaler daily  Continue your other medications  I will see you in 6 months     Signed, Peter Martinique, MD  10/09/2016 12:23 PM    [facility] [address], [place], [place], [address] [phone]  Addendum: cath films reviewed from 09/18/15: Native LAD and RCA occluded. First OM occluded. Mid LCX and second OM with obstructive disease that was successfully stented. LIMA sequential to diagonal and LAD is patent. SVG to OM1 is patent. SVG to PDA is patent-somewhat aneurysmal. Focal ulcerative but nonobstructive plaque in the proximal SVG body.  Peter Martinique MD, [facility]

## 2016-10-09 ENCOUNTER — Ambulatory Visit (INDEPENDENT_AMBULATORY_CARE_PROVIDER_SITE_OTHER): Payer: Medicare HMO | Admitting: Cardiology

## 2016-10-09 ENCOUNTER — Telehealth: Payer: Self-pay | Admitting: Cardiology

## 2016-10-09 ENCOUNTER — Other Ambulatory Visit: Payer: Self-pay

## 2016-10-09 VITALS — BP 151/71 | HR 60 | Ht 66.0 in | Wt 215.0 lb

## 2016-10-09 DIAGNOSIS — I25708 Atherosclerosis of coronary artery bypass graft(s), unspecified, with other forms of angina pectoris: Secondary | ICD-10-CM

## 2016-10-09 DIAGNOSIS — I10 Essential (primary) hypertension: Secondary | ICD-10-CM

## 2016-10-09 DIAGNOSIS — E7800 Pure hypercholesterolemia, unspecified: Secondary | ICD-10-CM | POA: Diagnosis not present

## 2016-10-09 DIAGNOSIS — J449 Chronic obstructive pulmonary disease, unspecified: Secondary | ICD-10-CM

## 2016-10-09 MED ORDER — TIOTROPIUM BROMIDE MONOHYDRATE 18 MCG IN CAPS: 18.0000 ug | ORAL_CAPSULE | Freq: Every day | RESPIRATORY_TRACT | Status: DC

## 2016-10-09 MED ORDER — TIOTROPIUM BROMIDE MONOHYDRATE 18 MCG IN CAPS: 18.0000 ug | ORAL_CAPSULE | Freq: Every morning | RESPIRATORY_TRACT | 6 refills | Status: DC

## 2016-10-09 MED ORDER — IPRATROPIUM BROMIDE HFA 17 MCG/ACT IN AERS: 2.0000 | INHALATION_SPRAY | Freq: Four times a day (QID) | RESPIRATORY_TRACT | 1 refills | Status: DC | PRN

## 2016-10-09 NOTE — Patient Instructions (Addendum)
You may stop Plavix now.   Add Spiriva inhaler daily  Continue your other medications  I will see you in 6 months

## 2016-10-09 NOTE — Telephone Encounter (Signed)
Pt was given RX for Spiriva inhaler this am, this rx is $200, can he get something cheaper that is comparable?

## 2016-10-09 NOTE — Telephone Encounter (Signed)
Returned call to patient's daughter Juliann Pulse.Dr.Jordan advised atrovent inh 2 puffs four times a day if needed.Advised to see PCP.

## 2016-12-15 ENCOUNTER — Telehealth: Payer: Self-pay

## 2016-12-15 NOTE — Telephone Encounter (Signed)
Received surgical clearance from [facility].Dr.Jordan cleared patient for upcoming surgery.Clearance and Dr.Jordan's 10/09/16 office note faxed back to fax # [phone].

## 2017-01-19 NOTE — H&P (Signed)
Kristopher Ayers is an 83 y.o. male.    Chief Complaint: right shoulder pain  HPI: Pt is a 83 y.o. male complaining of right shoulder pain for multiple years. Pain had continually increased since the beginning. X-rays in the clinic show end-stage arthritic changes of the right shoulder. Pt has tried various conservative treatments which have failed to alleviate their symptoms, including injections and therapy. Various options are discussed with the patient. Risks, benefits and expectations were discussed with the patient. Patient understand the risks, benefits and expectations and wishes to proceed with surgery.   PCP:  Lilian Coma., MD  D/C Plans: Home  PMH: Past Medical History:  Diagnosis Date  . CAD (coronary artery disease)   . COPD (chronic obstructive pulmonary disease) ([place])   . Diverticulosis   . DJD (degenerative joint disease)   . Hyperlipidemia   . Hypertension   . Internal hemorrhoids   . Kidney stone     PSH: Past Surgical History:  Procedure Laterality Date  . ANKLE FUSION    . CORONARY ARTERY BYPASS GRAFT    . INGUINAL HERNIA REPAIR    . LEG SURGERY    . LUMBAR LAMINECTOMY     x3  . ROTATOR CUFF REPAIR     x 2  . TONSILLECTOMY AND ADENOIDECTOMY    . TOTAL KNEE ARTHROPLASTY     left    Social History:  reports that he has quit smoking. He has never used smokeless tobacco. He reports that he does not drink alcohol or use drugs.  Allergies:  Allergies  Allergen Reactions  . Amlodipine Swelling  . Buprenorphine Hcl Other (See Comments)    UNKNOWN  . Diazepam Other (See Comments)    SOLN UNKNOWN   . Irbesartan Other (See Comments)    Cough/weakness  . Hydrocodone-Acetaminophen Rash  . Morphine And Related Rash  . Nsaids Rash    Renal insufficiency    Medications: No current facility-administered medications for this encounter.    Current Outpatient Prescriptions  Medication Sig Dispense Refill  . acetaminophen (TYLENOL) 325 MG tablet  Take 650 mg by mouth every 6 (six) hours as needed for moderate pain.     Marland Kitchen allopurinol (ZYLOPRIM) 100 MG tablet Take 100 mg by mouth daily.    Marland Kitchen aspirin 81 MG tablet Take 81 mg by mouth daily.    . chlorthalidone (HYGROTON) 25 MG tablet Take 12.5 mg by mouth daily.     . Cyanocobalamin (B-12 PO) Take 1 tablet by mouth daily.    . DULoxetine (CYMBALTA) 60 MG capsule Take 60 mg by mouth daily.    . fluticasone (FLONASE) 50 MCG/ACT nasal spray 1 spray by Each Nare route daily.    . fluticasone furoate-vilanterol (BREO ELLIPTA) 100-25 MCG/INH AEPB Inhale 1 puff into the lungs daily.    Marland Kitchen ipratropium (ATROVENT HFA) 17 MCG/ACT inhaler Inhale 2 puffs into the lungs every 6 (six) hours as needed for wheezing. 1 Inhaler 1  . metoprolol succinate (TOPROL-XL) 25 MG 24 hr tablet Take 25 mg by mouth every evening.     . pravastatin (PRAVACHOL) 40 MG tablet Take 40 mg by mouth daily.     . valsartan (DIOVAN) 80 MG tablet Take 80 mg by mouth daily.       No results found for this or any previous visit (from the past 48 hour(s)). No results found.  ROS: Pain with rom of the right upper extremity  Physical Exam:  Alert and oriented 83 y.o.  male in no acute distress Cranial nerves 2-12 intact Cervical spine: full rom with no tenderness, nv intact distally Chest: active breath sounds bilaterally, no wheeze rhonchi or rales Heart: regular rate and rhythm, no murmur Abd: non tender non distended with active bowel sounds Hip is stable with rom  Right shoulder with limited rom nv intact distally No rashes or edema Crepitus with rom  Assessment/Plan Assessment: right shoulder cuff arthropathy  Plan: Patient will undergo a right reverse total shoulder by Dr. Veverly Fells at [facility]. Risks benefits and expectations were discussed with the patient. Patient understand risks, benefits and expectations and wishes to proceed.

## 2017-01-21 ENCOUNTER — Encounter (HOSPITAL_COMMUNITY)
Admission: RE | Admit: 2017-01-21 | Discharge: 2017-01-21 | Disposition: A | Discharge status: Home / Self Care | Payer: Medicare HMO | Source: Ambulatory Visit | Attending: Orthopedic Surgery | Admitting: Orthopedic Surgery

## 2017-01-21 ENCOUNTER — Encounter (HOSPITAL_COMMUNITY): Payer: Self-pay

## 2017-01-21 DIAGNOSIS — Z01812 Encounter for preprocedural laboratory examination: Secondary | ICD-10-CM | POA: Diagnosis present

## 2017-01-21 DIAGNOSIS — Z79899 Other long term (current) drug therapy: Secondary | ICD-10-CM | POA: Diagnosis not present

## 2017-01-21 DIAGNOSIS — N189 Chronic kidney disease, unspecified: Secondary | ICD-10-CM | POA: Diagnosis not present

## 2017-01-21 DIAGNOSIS — Z7982 Long term (current) use of aspirin: Secondary | ICD-10-CM | POA: Insufficient documentation

## 2017-01-21 DIAGNOSIS — Z955 Presence of coronary angioplasty implant and graft: Secondary | ICD-10-CM | POA: Insufficient documentation

## 2017-01-21 DIAGNOSIS — E785 Hyperlipidemia, unspecified: Secondary | ICD-10-CM | POA: Diagnosis not present

## 2017-01-21 DIAGNOSIS — I2510 Atherosclerotic heart disease of native coronary artery without angina pectoris: Secondary | ICD-10-CM | POA: Insufficient documentation

## 2017-01-21 DIAGNOSIS — Z01818 Encounter for other preprocedural examination: Secondary | ICD-10-CM | POA: Insufficient documentation

## 2017-01-21 DIAGNOSIS — M12811 Other specific arthropathies, not elsewhere classified, right shoulder: Secondary | ICD-10-CM | POA: Insufficient documentation

## 2017-01-21 DIAGNOSIS — J449 Chronic obstructive pulmonary disease, unspecified: Secondary | ICD-10-CM | POA: Diagnosis not present

## 2017-01-21 DIAGNOSIS — Z951 Presence of aortocoronary bypass graft: Secondary | ICD-10-CM | POA: Insufficient documentation

## 2017-01-21 DIAGNOSIS — I129 Hypertensive chronic kidney disease with stage 1 through stage 4 chronic kidney disease, or unspecified chronic kidney disease: Secondary | ICD-10-CM | POA: Diagnosis not present

## 2017-01-21 DIAGNOSIS — Z87891 Personal history of nicotine dependence: Secondary | ICD-10-CM | POA: Insufficient documentation

## 2017-01-21 HISTORY — DX: Chronic kidney disease, unspecified: N18.9

## 2017-01-21 LAB — BASIC METABOLIC PANEL
Anion gap: 9 (ref 5–15)
BUN: 39 mg/dL — AB (ref 6–20)
CO2: 22 mmol/L (ref 22–32)
Calcium: 9 mg/dL (ref 8.9–10.3)
Chloride: 105 mmol/L (ref 101–111)
Creatinine, Ser: 1.53 mg/dL — ABNORMAL HIGH (ref 0.61–1.24)
GFR calc Af Amer: 45 mL/min — ABNORMAL LOW (ref >60)
GFR, EST NON AFRICAN AMERICAN: 39 mL/min — AB (ref >60)
GLUCOSE: 104 mg/dL — AB (ref 65–99)
POTASSIUM: 4.7 mmol/L (ref 3.5–5.1)
Sodium: 136 mmol/L (ref 135–145)

## 2017-01-21 LAB — CBC
HEMATOCRIT: 40.3 % (ref 39.0–52.0)
Hemoglobin: 13.1 g/dL (ref 13.0–17.0)
MCH: 31.2 pg (ref 26.0–34.0)
MCHC: 32.5 g/dL (ref 30.0–36.0)
MCV: 96 fL (ref 78.0–100.0)
Platelets: 158 10*3/uL (ref 150–400)
RBC: 4.2 MIL/uL — ABNORMAL LOW (ref 4.22–5.81)
RDW: 13.6 % (ref 11.5–15.5)
WBC: 6.5 10*3/uL (ref 4.0–10.5)

## 2017-01-21 LAB — SURGICAL PCR SCREEN
MRSA, PCR: NEGATIVE
Staphylococcus aureus: NEGATIVE

## 2017-01-21 NOTE — Progress Notes (Signed)
PCP - daniel Jobe Cardiologist - Martinique  Chest x-ray - not needed EKG - 02/23/16 Stress Test - 08/2015 - requesting ECHO - 09/18/15 Cardiac Cath - 09/18/15  Sending to anesthesia for heart history   Patient denies shortness of breath, fever, cough and chest pain at PAT appointment   Patient verbalized understanding of instructions that were given to them at the PAT appointment. Patient was also instructed that they will need to review over the PAT instructions again at home before surgery.

## 2017-01-21 NOTE — Pre-Procedure Instructions (Signed)
Kristopher Ayers  01/21/2017      [place], [place] - [address] [place] [address] Phone: [phone] Fax: [phone]  [id number] - [place], [place] - [address] [place] [address] Phone: [phone] Fax: [phone]    Your procedure is scheduled on June 1  Report to [place] [facility] at [place].M.  Call this number if you have problems the morning of surgery:  [phone]   Remember:  Do not eat food or drink liquids after midnight.   Take these medicines the morning of surgery with A SIP OF WATER acetaminophen (TYLENOL) if needed, allopurinol (ZYLOPRIM) , DULoxetine (CYMBALTA), fluticasone (FLONASE) , fluticasone furoate-vilanterol (BREO ELLIPTA) , ipratropium (ATROVENT HFA) if needed, metoprolol succinate (TOPROL-XL) Bring all inhalers with you the morning of surgery  7 days prior to surgery STOP taking any Aspirin, Aleve, Naproxen, Ibuprofen, Motrin, Advil, Goody's, BC's, all herbal medications, fish oil, and all vitamins   Do not wear jewelry  Do not wear lotions, powders, or cologne, or deoderant.  Men may shave face and neck.  Do not bring valuables to the hospital.  [facility] is not responsible for any belongings or valuables.  Contacts, dentures or bridgework may not be worn into surgery.  Leave your suitcase in the car.  After surgery it may be brought to your room.  For patients admitted to the hospital, discharge time will be determined by your treatment team.  Patients discharged the day of surgery will not be allowed to drive home.    Special instructions:   [facility]- Preparing For Surgery  Before surgery, you can play an important role. Because skin is not sterile, your skin needs to be as free of germs as possible. You can reduce the number of germs on your skin by washing with CHG (chlorahexidine gluconate) Soap before surgery.  CHG is an  antiseptic cleaner which kills germs and bonds with the skin to continue killing germs even after washing.  Please do not use if you have an allergy to CHG or antibacterial soaps. If your skin becomes reddened/irritated stop using the CHG.  Do not shave (including legs and underarms) for at least 48 hours prior to first CHG shower. It is OK to shave your face.  Please follow these instructions carefully.   1. Shower the NIGHT BEFORE SURGERY and the MORNING OF SURGERY with CHG.   2. If you chose to wash your hair, wash your hair first as usual with your normal shampoo.  3. After you shampoo, rinse your hair and body thoroughly to remove the shampoo.  4. Use CHG as you would any other liquid soap. You can apply CHG directly to the skin and wash gently with a scrungie or a clean washcloth.   5. Apply the CHG Soap to your body ONLY FROM THE NECK DOWN.  Do not use on open wounds or open sores. Avoid contact with your eyes, ears, mouth and genitals (private parts). Wash genitals (private parts) with your normal soap.  6. Wash thoroughly, paying special attention to the area where your surgery will be performed.  7. Thoroughly rinse your body with warm water from the neck down.  8. DO NOT shower/wash with your normal soap after using and rinsing off the CHG Soap.  9. Pat yourself dry with a CLEAN TOWEL.   10. Wear CLEAN PAJAMAS   11. Place CLEAN SHEETS  on your bed the night of your first shower and DO NOT SLEEP WITH PETS.    Day of Surgery: Do not apply any deodorants/lotions. Please wear clean clothes to the hospital/surgery center.      Please read over the following fact sheets that you were given.

## 2017-01-22 ENCOUNTER — Encounter (HOSPITAL_COMMUNITY): Payer: Self-pay

## 2017-01-22 NOTE — Progress Notes (Signed)
Anesthesia Chart Review:  Pt is an 83 year old male scheduled for R reverse shoulder arthroplasty on 01/29/2017 with Netta Cedars, MD  - PCP Tiana Loft, MD (notes in care everywhere). Cleared for surgery at last office visit 12/17/16 with Lanier Prude, [place] - Cardiologist is Peter Martinique, MD who has cleared pt for surgery. Last office visit 10/09/16.   PMH includes:  CAD (s/p CABG 2001; DES to CX and DES to OM1 09/18/15), HTN, hyperlipidemia, COPD, CKD.  Former smoker. BMI 35.5  Medications include: ASA 81 mg, chlorthalidone, Atrovent, metoprolol, pravastatin, valsartan  Preoperative labs reviewed.  Cr 1.53, BUN 39. This is consistent with prior results.   EKG 02/23/16: sinus rhythm with 1st degree AV block with PACs. LAD. LVH with QRS widening and repolarization abnormality  Echo 09/18/15 (care everywhere) :  - Ejection fraction is visually estimated at 50-55% The left ventricle was not well visualized, but grossly low normal LV function. Probable mild to moderate left ventricular hypertrophy  Cardiac cath 09/18/15 (care everywhere):  Diagnostic Summary - Multivessel CAD - Patent grafts - LIMA LAd, SVG RCA, SVG Ramus - Progression of native un-bypassed disease - LCx and OM1  Interventional Summary - Successful PCI /DES mid CX  - Successful PCI / DES mid 1st Obtuse Marginal  If no changes, I anticipate pt can proceed with surgery as scheduled.   Willeen Cass, FNP-BC [facility]/Anesthesiology Phone: [phone] 01/22/2017 10:22 AM

## 2017-01-29 ENCOUNTER — Inpatient Hospital Stay (HOSPITAL_COMMUNITY): Payer: Medicare HMO

## 2017-01-29 ENCOUNTER — Inpatient Hospital Stay (HOSPITAL_COMMUNITY)
Admission: RE | Admit: 2017-01-29 | Discharge: 2017-02-06 | DRG: 483 | Disposition: A | Discharge status: Skilled Nursing Facility | Payer: Medicare HMO | Source: Ambulatory Visit | Attending: Orthopedic Surgery | Admitting: Orthopedic Surgery

## 2017-01-29 ENCOUNTER — Inpatient Hospital Stay (HOSPITAL_COMMUNITY): Payer: Medicare HMO | Admitting: Anesthesiology

## 2017-01-29 ENCOUNTER — Encounter (HOSPITAL_COMMUNITY): Payer: Self-pay | Admitting: *Deleted

## 2017-01-29 ENCOUNTER — Encounter (HOSPITAL_COMMUNITY)
Admission: RE | Disposition: A | Discharge status: Skilled Nursing Facility | Payer: Self-pay | Source: Ambulatory Visit | Attending: Orthopedic Surgery

## 2017-01-29 DIAGNOSIS — R001 Bradycardia, unspecified: Secondary | ICD-10-CM | POA: Diagnosis not present

## 2017-01-29 DIAGNOSIS — N183 Chronic kidney disease, stage 3 unspecified: Secondary | ICD-10-CM

## 2017-01-29 DIAGNOSIS — D631 Anemia in chronic kidney disease: Secondary | ICD-10-CM | POA: Diagnosis present

## 2017-01-29 DIAGNOSIS — Z96611 Presence of right artificial shoulder joint: Secondary | ICD-10-CM | POA: Diagnosis not present

## 2017-01-29 DIAGNOSIS — J449 Chronic obstructive pulmonary disease, unspecified: Secondary | ICD-10-CM | POA: Diagnosis present

## 2017-01-29 DIAGNOSIS — I10 Essential (primary) hypertension: Secondary | ICD-10-CM | POA: Diagnosis present

## 2017-01-29 DIAGNOSIS — M12811 Other specific arthropathies, not elsewhere classified, right shoulder: Secondary | ICD-10-CM | POA: Diagnosis present

## 2017-01-29 DIAGNOSIS — R7989 Other specified abnormal findings of blood chemistry: Secondary | ICD-10-CM

## 2017-01-29 DIAGNOSIS — I129 Hypertensive chronic kidney disease with stage 1 through stage 4 chronic kidney disease, or unspecified chronic kidney disease: Secondary | ICD-10-CM | POA: Diagnosis present

## 2017-01-29 DIAGNOSIS — N179 Acute kidney failure, unspecified: Secondary | ICD-10-CM | POA: Diagnosis present

## 2017-01-29 DIAGNOSIS — I248 Other forms of acute ischemic heart disease: Secondary | ICD-10-CM | POA: Diagnosis present

## 2017-01-29 DIAGNOSIS — I2510 Atherosclerotic heart disease of native coronary artery without angina pectoris: Secondary | ICD-10-CM | POA: Diagnosis present

## 2017-01-29 DIAGNOSIS — Z87891 Personal history of nicotine dependence: Secondary | ICD-10-CM

## 2017-01-29 DIAGNOSIS — Z96652 Presence of left artificial knee joint: Secondary | ICD-10-CM | POA: Diagnosis present

## 2017-01-29 DIAGNOSIS — M75101 Unspecified rotator cuff tear or rupture of right shoulder, not specified as traumatic: Principal | ICD-10-CM | POA: Diagnosis present

## 2017-01-29 DIAGNOSIS — E785 Hyperlipidemia, unspecified: Secondary | ICD-10-CM | POA: Diagnosis present

## 2017-01-29 DIAGNOSIS — K5900 Constipation, unspecified: Secondary | ICD-10-CM | POA: Diagnosis present

## 2017-01-29 DIAGNOSIS — R1310 Dysphagia, unspecified: Secondary | ICD-10-CM | POA: Diagnosis present

## 2017-01-29 DIAGNOSIS — E860 Dehydration: Secondary | ICD-10-CM | POA: Diagnosis not present

## 2017-01-29 DIAGNOSIS — E7800 Pure hypercholesterolemia, unspecified: Secondary | ICD-10-CM | POA: Diagnosis not present

## 2017-01-29 DIAGNOSIS — Z951 Presence of aortocoronary bypass graft: Secondary | ICD-10-CM | POA: Diagnosis not present

## 2017-01-29 DIAGNOSIS — R739 Hyperglycemia, unspecified: Secondary | ICD-10-CM | POA: Diagnosis present

## 2017-01-29 DIAGNOSIS — Z888 Allergy status to other drugs, medicaments and biological substances status: Secondary | ICD-10-CM

## 2017-01-29 DIAGNOSIS — R410 Disorientation, unspecified: Secondary | ICD-10-CM | POA: Diagnosis not present

## 2017-01-29 DIAGNOSIS — Z885 Allergy status to narcotic agent status: Secondary | ICD-10-CM | POA: Diagnosis not present

## 2017-01-29 DIAGNOSIS — I499 Cardiac arrhythmia, unspecified: Secondary | ICD-10-CM | POA: Diagnosis not present

## 2017-01-29 DIAGNOSIS — N189 Chronic kidney disease, unspecified: Secondary | ICD-10-CM | POA: Diagnosis not present

## 2017-01-29 DIAGNOSIS — Z79899 Other long term (current) drug therapy: Secondary | ICD-10-CM | POA: Diagnosis not present

## 2017-01-29 DIAGNOSIS — M7989 Other specified soft tissue disorders: Secondary | ICD-10-CM | POA: Diagnosis not present

## 2017-01-29 DIAGNOSIS — I493 Ventricular premature depolarization: Secondary | ICD-10-CM | POA: Diagnosis not present

## 2017-01-29 DIAGNOSIS — Z7951 Long term (current) use of inhaled steroids: Secondary | ICD-10-CM

## 2017-01-29 DIAGNOSIS — Z7982 Long term (current) use of aspirin: Secondary | ICD-10-CM

## 2017-01-29 DIAGNOSIS — K210 Gastro-esophageal reflux disease with esophagitis: Secondary | ICD-10-CM | POA: Diagnosis present

## 2017-01-29 DIAGNOSIS — M19011 Primary osteoarthritis, right shoulder: Secondary | ICD-10-CM | POA: Diagnosis not present

## 2017-01-29 DIAGNOSIS — R1013 Epigastric pain: Secondary | ICD-10-CM

## 2017-01-29 DIAGNOSIS — R4182 Altered mental status, unspecified: Secondary | ICD-10-CM

## 2017-01-29 DIAGNOSIS — R5383 Other fatigue: Secondary | ICD-10-CM

## 2017-01-29 DIAGNOSIS — R748 Abnormal levels of other serum enzymes: Secondary | ICD-10-CM | POA: Diagnosis not present

## 2017-01-29 DIAGNOSIS — I2489 Other forms of acute ischemic heart disease: Secondary | ICD-10-CM

## 2017-01-29 DIAGNOSIS — R778 Other specified abnormalities of plasma proteins: Secondary | ICD-10-CM

## 2017-01-29 DIAGNOSIS — R109 Unspecified abdominal pain: Secondary | ICD-10-CM

## 2017-01-29 HISTORY — PX: REVERSE SHOULDER ARTHROPLASTY: SHX5054

## 2017-01-29 LAB — BASIC METABOLIC PANEL
Anion gap: 8 (ref 5–15)
BUN: 36 mg/dL — ABNORMAL HIGH (ref 6–20)
CALCIUM: 8.6 mg/dL — AB (ref 8.9–10.3)
CO2: 23 mmol/L (ref 22–32)
Chloride: 108 mmol/L (ref 101–111)
Creatinine, Ser: 1.45 mg/dL — ABNORMAL HIGH (ref 0.61–1.24)
GFR, EST AFRICAN AMERICAN: 48 mL/min — AB (ref >60)
GFR, EST NON AFRICAN AMERICAN: 41 mL/min — AB (ref >60)
Glucose, Bld: 130 mg/dL — ABNORMAL HIGH (ref 65–99)
Potassium: 5 mmol/L (ref 3.5–5.1)
SODIUM: 139 mmol/L (ref 135–145)

## 2017-01-29 LAB — CBC
HCT: 37.5 % — ABNORMAL LOW (ref 39.0–52.0)
Hemoglobin: 12 g/dL — ABNORMAL LOW (ref 13.0–17.0)
MCH: 31.3 pg (ref 26.0–34.0)
MCHC: 32 g/dL (ref 30.0–36.0)
MCV: 97.9 fL (ref 78.0–100.0)
Platelets: 144 10*3/uL — ABNORMAL LOW (ref 150–400)
RBC: 3.83 MIL/uL — AB (ref 4.22–5.81)
RDW: 13.8 % (ref 11.5–15.5)
WBC: 9.9 10*3/uL (ref 4.0–10.5)

## 2017-01-29 LAB — MAGNESIUM: MAGNESIUM: 1.7 mg/dL (ref 1.7–2.4)

## 2017-01-29 LAB — TSH: TSH: 1.269 u[IU]/mL (ref 0.350–4.500)

## 2017-01-29 SURGERY — ARTHROPLASTY, SHOULDER, TOTAL, REVERSE
Anesthesia: General | Site: Shoulder | Laterality: Right

## 2017-01-29 MED ORDER — METOCLOPRAMIDE HCL 5 MG PO TABS
5.0000 mg | ORAL_TABLET | Freq: Three times a day (TID) | ORAL | Status: DC | PRN
  Administered 2017-02-04: 5 mg via ORAL
  Filled 2017-01-29 (×2): qty 2

## 2017-01-29 MED ORDER — PROPOFOL 10 MG/ML IV BOLUS
INTRAVENOUS | Status: DC | PRN
  Administered 2017-01-29: 60 mg via INTRAVENOUS
  Administered 2017-01-29: 20 mg via INTRAVENOUS

## 2017-01-29 MED ORDER — ACETAMINOPHEN 650 MG RE SUPP: 650.0000 mg | Freq: Four times a day (QID) | RECTAL | Status: DC | PRN

## 2017-01-29 MED ORDER — PHENOL 1.4 % MT LIQD: 1.0000 | OROMUCOSAL | Status: DC | PRN

## 2017-01-29 MED ORDER — ACETAMINOPHEN 325 MG PO TABS: 650.0000 mg | ORAL_TABLET | Freq: Four times a day (QID) | ORAL | Status: DC | PRN

## 2017-01-29 MED ORDER — IPRATROPIUM BROMIDE 0.02 % IN SOLN: 0.5000 mg | Freq: Four times a day (QID) | RESPIRATORY_TRACT | Status: DC | PRN

## 2017-01-29 MED ORDER — METHOCARBAMOL 500 MG PO TABS
500.0000 mg | ORAL_TABLET | Freq: Four times a day (QID) | ORAL | Status: DC | PRN
  Administered 2017-01-29 – 2017-01-30 (×2): 500 mg via ORAL
  Filled 2017-01-29 (×2): qty 1

## 2017-01-29 MED ORDER — BUPIVACAINE HCL (PF) 0.25 % IJ SOLN
INTRAMUSCULAR | Status: AC
  Filled 2017-01-29: qty 30

## 2017-01-29 MED ORDER — ASPIRIN 81 MG PO CHEW
81.0000 mg | CHEWABLE_TABLET | Freq: Every day | ORAL | Status: DC
  Administered 2017-01-30 – 2017-02-06 (×8): 81 mg via ORAL
  Filled 2017-01-29 (×8): qty 1

## 2017-01-29 MED ORDER — ROCURONIUM BROMIDE 10 MG/ML (PF) SYRINGE
PREFILLED_SYRINGE | INTRAVENOUS | Status: AC
  Filled 2017-01-29: qty 5

## 2017-01-29 MED ORDER — METOPROLOL SUCCINATE ER 25 MG PO TB24
25.0000 mg | ORAL_TABLET | Freq: Every evening | ORAL | Status: DC
  Administered 2017-01-30 – 2017-02-02 (×4): 25 mg via ORAL
  Filled 2017-01-29 (×5): qty 1

## 2017-01-29 MED ORDER — LIDOCAINE HCL (CARDIAC) 20 MG/ML IV SOLN
INTRAVENOUS | Status: DC | PRN
  Administered 2017-01-29: 80 mg via INTRAVENOUS

## 2017-01-29 MED ORDER — PROPOFOL 10 MG/ML IV BOLUS
INTRAVENOUS | Status: AC
  Filled 2017-01-29: qty 20

## 2017-01-29 MED ORDER — SODIUM CHLORIDE 0.9 % IV SOLN
INTRAVENOUS | Status: DC
  Administered 2017-01-29 (×2): via INTRAVENOUS

## 2017-01-29 MED ORDER — FENTANYL CITRATE (PF) 100 MCG/2ML IJ SOLN
50.0000 ug | Freq: Once | INTRAMUSCULAR | Status: AC
  Administered 2017-01-29: 50 ug via INTRAVENOUS
  Filled 2017-01-29: qty 1

## 2017-01-29 MED ORDER — EPHEDRINE SULFATE-NACL 50-0.9 MG/10ML-% IV SOSY
PREFILLED_SYRINGE | INTRAVENOUS | Status: DC | PRN
  Administered 2017-01-29: 5 mg via INTRAVENOUS
  Administered 2017-01-29 (×3): 10 mg via INTRAVENOUS
  Administered 2017-01-29: 5 mg via INTRAVENOUS

## 2017-01-29 MED ORDER — METHOCARBAMOL 1000 MG/10ML IJ SOLN
500.0000 mg | Freq: Four times a day (QID) | INTRAMUSCULAR | Status: DC | PRN
  Filled 2017-01-29: qty 5

## 2017-01-29 MED ORDER — ACETAMINOPHEN 325 MG PO TABS
650.0000 mg | ORAL_TABLET | Freq: Four times a day (QID) | ORAL | Status: DC | PRN
  Administered 2017-01-31 – 2017-02-03 (×3): 650 mg via ORAL
  Filled 2017-01-29 (×3): qty 2

## 2017-01-29 MED ORDER — ONDANSETRON HCL 4 MG/2ML IJ SOLN
4.0000 mg | Freq: Four times a day (QID) | INTRAMUSCULAR | Status: DC | PRN
  Administered 2017-01-29 – 2017-01-30 (×3): 4 mg via INTRAVENOUS
  Filled 2017-01-29 (×3): qty 2

## 2017-01-29 MED ORDER — LACTATED RINGERS IV SOLN
INTRAVENOUS | Status: DC
  Administered 2017-01-29 (×2): via INTRAVENOUS

## 2017-01-29 MED ORDER — GLYCOPYRROLATE 0.2 MG/ML IJ SOLN
INTRAMUSCULAR | Status: DC | PRN
  Administered 2017-01-29 (×2): 0.1 mg via INTRAVENOUS

## 2017-01-29 MED ORDER — TRAMADOL HCL 50 MG PO TABS: 50.0000 mg | ORAL_TABLET | Freq: Four times a day (QID) | ORAL | 0 refills | Status: DC | PRN

## 2017-01-29 MED ORDER — CEFAZOLIN SODIUM-DEXTROSE 2-4 GM/100ML-% IV SOLN
2.0000 g | INTRAVENOUS | Status: AC
  Administered 2017-01-29: 2 g via INTRAVENOUS

## 2017-01-29 MED ORDER — GLYCOPYRROLATE 0.2 MG/ML IJ SOLN
0.1000 mg | Freq: Once | INTRAMUSCULAR | Status: AC
  Administered 2017-01-29: 0.1 mg via INTRAVENOUS

## 2017-01-29 MED ORDER — CEFAZOLIN SODIUM-DEXTROSE 2-4 GM/100ML-% IV SOLN
INTRAVENOUS | Status: AC
  Filled 2017-01-29: qty 100

## 2017-01-29 MED ORDER — OXYCODONE HCL 5 MG PO TABS
2.5000 mg | ORAL_TABLET | ORAL | Status: DC | PRN
  Administered 2017-01-29 – 2017-01-30 (×2): 5 mg via ORAL
  Filled 2017-01-29 (×2): qty 1

## 2017-01-29 MED ORDER — CHLORHEXIDINE GLUCONATE 4 % EX LIQD: 60.0000 mL | Freq: Once | CUTANEOUS | Status: DC

## 2017-01-29 MED ORDER — MIDAZOLAM HCL 2 MG/2ML IJ SOLN
INTRAMUSCULAR | Status: AC
  Filled 2017-01-29: qty 2

## 2017-01-29 MED ORDER — PHENYLEPHRINE HCL 10 MG/ML IJ SOLN
INTRAMUSCULAR | Status: DC | PRN
  Administered 2017-01-29: 25 ug/min via INTRAVENOUS

## 2017-01-29 MED ORDER — ONDANSETRON HCL 4 MG/2ML IJ SOLN
INTRAMUSCULAR | Status: AC
  Filled 2017-01-29: qty 2

## 2017-01-29 MED ORDER — ROCURONIUM BROMIDE 100 MG/10ML IV SOLN
INTRAVENOUS | Status: DC | PRN
Start: 2017-01-29 — End: 2017-01-29
  Administered 2017-01-29: 30 mg via INTRAVENOUS

## 2017-01-29 MED ORDER — FENTANYL CITRATE (PF) 250 MCG/5ML IJ SOLN
INTRAMUSCULAR | Status: AC
  Filled 2017-01-29: qty 5

## 2017-01-29 MED ORDER — DULOXETINE HCL 60 MG PO CPEP
60.0000 mg | ORAL_CAPSULE | Freq: Every day | ORAL | Status: DC
  Administered 2017-01-29 – 2017-02-06 (×9): 60 mg via ORAL
  Filled 2017-01-29 (×9): qty 1

## 2017-01-29 MED ORDER — ONDANSETRON HCL 4 MG PO TABS
4.0000 mg | ORAL_TABLET | Freq: Four times a day (QID) | ORAL | Status: DC | PRN
  Administered 2017-01-31: 4 mg via ORAL
  Filled 2017-01-29 (×2): qty 1

## 2017-01-29 MED ORDER — POLYETHYLENE GLYCOL 3350 17 G PO PACK
17.0000 g | PACK | Freq: Every day | ORAL | Status: DC | PRN
  Administered 2017-02-04: 17 g via ORAL
  Filled 2017-01-29: qty 1

## 2017-01-29 MED ORDER — PRAVASTATIN SODIUM 40 MG PO TABS
40.0000 mg | ORAL_TABLET | Freq: Every day | ORAL | Status: DC
  Administered 2017-01-29 – 2017-02-05 (×7): 40 mg via ORAL
  Filled 2017-01-29 (×8): qty 1

## 2017-01-29 MED ORDER — SUGAMMADEX SODIUM 200 MG/2ML IV SOLN
INTRAVENOUS | Status: DC | PRN
  Administered 2017-01-29: 200 mg via INTRAVENOUS

## 2017-01-29 MED ORDER — FLUTICASONE FUROATE-VILANTEROL 100-25 MCG/INH IN AEPB
1.0000 | INHALATION_SPRAY | Freq: Every day | RESPIRATORY_TRACT | Status: DC
  Administered 2017-01-30 – 2017-02-06 (×8): 1 via RESPIRATORY_TRACT
  Filled 2017-01-29 (×2): qty 28

## 2017-01-29 MED ORDER — EPINEPHRINE PF 1 MG/ML IJ SOLN
INTRAMUSCULAR | Status: AC
  Filled 2017-01-29: qty 1

## 2017-01-29 MED ORDER — ONDANSETRON HCL 4 MG/2ML IJ SOLN
INTRAMUSCULAR | Status: DC | PRN
  Administered 2017-01-29: 4 mg via INTRAVENOUS

## 2017-01-29 MED ORDER — CEFAZOLIN SODIUM-DEXTROSE 2-4 GM/100ML-% IV SOLN
2.0000 g | Freq: Three times a day (TID) | INTRAVENOUS | Status: AC
  Administered 2017-01-29 (×2): 2 g via INTRAVENOUS
  Filled 2017-01-29 (×2): qty 100

## 2017-01-29 MED ORDER — BUPIVACAINE-EPINEPHRINE 0.25% -1:200000 IJ SOLN
INTRAMUSCULAR | Status: DC | PRN
  Administered 2017-01-29: 10 mL

## 2017-01-29 MED ORDER — FENTANYL CITRATE (PF) 100 MCG/2ML IJ SOLN
INTRAMUSCULAR | Status: AC
  Administered 2017-01-29: 50 ug via INTRAVENOUS
  Filled 2017-01-29: qty 2

## 2017-01-29 MED ORDER — LIDOCAINE 2% (20 MG/ML) 5 ML SYRINGE
INTRAMUSCULAR | Status: AC
  Filled 2017-01-29: qty 5

## 2017-01-29 MED ORDER — CHLORTHALIDONE 25 MG PO TABS
12.5000 mg | ORAL_TABLET | Freq: Every day | ORAL | Status: DC
  Administered 2017-01-30 – 2017-02-06 (×8): 12.5 mg via ORAL
  Filled 2017-01-29 (×9): qty 0.5

## 2017-01-29 MED ORDER — MENTHOL 3 MG MT LOZG: 1.0000 | LOZENGE | OROMUCOSAL | Status: DC | PRN

## 2017-01-29 MED ORDER — SUGAMMADEX SODIUM 200 MG/2ML IV SOLN
INTRAVENOUS | Status: AC
  Filled 2017-01-29: qty 2

## 2017-01-29 MED ORDER — IRBESARTAN 150 MG PO TABS
75.0000 mg | ORAL_TABLET | Freq: Every day | ORAL | Status: DC
  Administered 2017-01-30 – 2017-02-06 (×8): 75 mg via ORAL
  Filled 2017-01-29 (×8): qty 1

## 2017-01-29 MED ORDER — TRAMADOL HCL 50 MG PO TABS
50.0000 mg | ORAL_TABLET | ORAL | Status: DC | PRN
  Administered 2017-02-03 – 2017-02-05 (×5): 50 mg via ORAL
  Filled 2017-01-29 (×6): qty 1

## 2017-01-29 MED ORDER — BUPIVACAINE-EPINEPHRINE (PF) 0.5% -1:200000 IJ SOLN
INTRAMUSCULAR | Status: DC | PRN
Start: 2017-01-29 — End: 2017-01-29
  Administered 2017-01-29: 20 mL via PERINEURAL

## 2017-01-29 MED ORDER — SODIUM CHLORIDE 0.9 % IV BOLUS (SEPSIS)
500.0000 mL | Freq: Once | INTRAVENOUS | Status: AC
  Administered 2017-01-29: 500 mL via INTRAVENOUS

## 2017-01-29 MED ORDER — METOCLOPRAMIDE HCL 5 MG/ML IJ SOLN
5.0000 mg | Freq: Three times a day (TID) | INTRAMUSCULAR | Status: DC | PRN
Start: 2017-01-29 — End: 2017-02-06
  Administered 2017-01-30: 10 mg via INTRAVENOUS
  Filled 2017-01-29: qty 2

## 2017-01-29 MED ORDER — FLUTICASONE PROPIONATE 50 MCG/ACT NA SUSP
1.0000 | Freq: Every day | NASAL | Status: DC
  Administered 2017-01-31 – 2017-02-06 (×7): 1 via NASAL
  Filled 2017-01-29: qty 16

## 2017-01-29 MED ORDER — DOCUSATE SODIUM 100 MG PO CAPS
100.0000 mg | ORAL_CAPSULE | Freq: Two times a day (BID) | ORAL | Status: DC
  Administered 2017-01-29 – 2017-02-06 (×15): 100 mg via ORAL
  Filled 2017-01-29 (×15): qty 1

## 2017-01-29 MED ORDER — FENTANYL CITRATE (PF) 100 MCG/2ML IJ SOLN
25.0000 ug | INTRAMUSCULAR | Status: DC | PRN
Start: 2017-01-29 — End: 2017-01-29

## 2017-01-29 MED ORDER — GLYCOPYRROLATE 0.2 MG/ML IJ SOLN
INTRAMUSCULAR | Status: AC
  Filled 2017-01-29: qty 1

## 2017-01-29 MED ORDER — IPRATROPIUM BROMIDE HFA 17 MCG/ACT IN AERS: 2.0000 | INHALATION_SPRAY | Freq: Four times a day (QID) | RESPIRATORY_TRACT | Status: DC | PRN

## 2017-01-29 MED ORDER — ALLOPURINOL 100 MG PO TABS
100.0000 mg | ORAL_TABLET | Freq: Every day | ORAL | Status: DC
  Administered 2017-01-30 – 2017-02-06 (×8): 100 mg via ORAL
  Filled 2017-01-29 (×8): qty 1

## 2017-01-29 MED ORDER — 0.9 % SODIUM CHLORIDE (POUR BTL) OPTIME
TOPICAL | Status: DC | PRN
  Administered 2017-01-29: 1000 mL

## 2017-01-29 SURGICAL SUPPLY — 68 items
BIT DRILL 170X2.5X (BIT) IMPLANT
BIT DRILL 5/64X5 DISP (BIT) ×1 IMPLANT
BIT DRL 170X2.5X (BIT)
BLADE SAG 18X100X1.27 (BLADE) ×3 IMPLANT
CAPT SHLDR REVTOTAL 1 ×2 IMPLANT
CLOSURE WOUND 1/2 X4 (GAUZE/BANDAGES/DRESSINGS) ×1
COVER SURGICAL LIGHT HANDLE (MISCELLANEOUS) ×3 IMPLANT
DRAPE IMP U-DRAPE 54X76 (DRAPES) ×6 IMPLANT
DRAPE INCISE IOBAN 66X45 STRL (DRAPES) ×3 IMPLANT
DRAPE ORTHO SPLIT 77X108 STRL (DRAPES) ×6
DRAPE SURG ORHT 6 SPLT 77X108 (DRAPES) ×2 IMPLANT
DRAPE U-SHAPE 47X51 STRL (DRAPES) ×3 IMPLANT
DRILL 2.5 (BIT)
DRSG ADAPTIC 3X8 NADH LF (GAUZE/BANDAGES/DRESSINGS) ×3 IMPLANT
DRSG PAD ABDOMINAL 8X10 ST (GAUZE/BANDAGES/DRESSINGS) ×3 IMPLANT
DURAPREP 26ML APPLICATOR (WOUND CARE) ×3 IMPLANT
ELECT BLADE 4.0 EZ CLEAN MEGAD (MISCELLANEOUS) ×3
ELECT NDL TIP 2.8 STRL (NEEDLE) ×1 IMPLANT
ELECT NEEDLE TIP 2.8 STRL (NEEDLE) ×3 IMPLANT
ELECT REM PT RETURN 9FT ADLT (ELECTROSURGICAL) ×3
ELECTRODE BLDE 4.0 EZ CLN MEGD (MISCELLANEOUS) ×1 IMPLANT
ELECTRODE REM PT RTRN 9FT ADLT (ELECTROSURGICAL) ×1 IMPLANT
GAUZE SPONGE 4X4 12PLY STRL (GAUZE/BANDAGES/DRESSINGS) ×3 IMPLANT
GLOVE BIOGEL PI ORTHO PRO 7.5 (GLOVE) ×2
GLOVE BIOGEL PI ORTHO PRO SZ8 (GLOVE) ×2
GLOVE ORTHO TXT STRL SZ7.5 (GLOVE) ×3 IMPLANT
GLOVE PI ORTHO PRO STRL 7.5 (GLOVE) ×1 IMPLANT
GLOVE PI ORTHO PRO STRL SZ8 (GLOVE) ×1 IMPLANT
GLOVE SURG ORTHO 8.5 STRL (GLOVE) ×3 IMPLANT
GOWN STRL REUS W/ TWL LRG LVL3 (GOWN DISPOSABLE) ×1 IMPLANT
GOWN STRL REUS W/ TWL XL LVL3 (GOWN DISPOSABLE) ×2 IMPLANT
GOWN STRL REUS W/TWL LRG LVL3 (GOWN DISPOSABLE) ×3
GOWN STRL REUS W/TWL XL LVL3 (GOWN DISPOSABLE) ×6
KIT BASIN OR (CUSTOM PROCEDURE TRAY) ×3 IMPLANT
KIT ROOM TURNOVER OR (KITS) ×3 IMPLANT
MANIFOLD NEPTUNE II (INSTRUMENTS) ×3 IMPLANT
NDL 1/2 CIR MAYO (NEEDLE) ×1 IMPLANT
NDL FILTER BLUNT 18X1 1/2 (NEEDLE) IMPLANT
NDL HYPO 25GX1X1/2 BEV (NEEDLE) ×1 IMPLANT
NEEDLE 1/2 CIR MAYO (NEEDLE) IMPLANT
NEEDLE FILTER BLUNT 18X 1/2SAF (NEEDLE) ×2
NEEDLE FILTER BLUNT 18X1 1/2 (NEEDLE) ×1 IMPLANT
NEEDLE HYPO 25GX1X1/2 BEV (NEEDLE) ×3 IMPLANT
NS IRRIG 1000ML POUR BTL (IV SOLUTION) ×3 IMPLANT
PACK SHOULDER (CUSTOM PROCEDURE TRAY) ×3 IMPLANT
PAD ARMBOARD 7.5X6 YLW CONV (MISCELLANEOUS) ×6 IMPLANT
PIN METAGLENE 2.5 (PIN) IMPLANT
SLING ARM FOAM STRAP LRG (SOFTGOODS) ×2 IMPLANT
SLING ARM FOAM STRAP MED (SOFTGOODS) IMPLANT
SPONGE LAP 18X18 X RAY DECT (DISPOSABLE) IMPLANT
SPONGE LAP 4X18 X RAY DECT (DISPOSABLE) ×3 IMPLANT
STRIP CLOSURE SKIN 1/2X4 (GAUZE/BANDAGES/DRESSINGS) ×2 IMPLANT
SUCTION FRAZIER HANDLE 10FR (MISCELLANEOUS) ×2
SUCTION TUBE FRAZIER 10FR DISP (MISCELLANEOUS) ×1 IMPLANT
SUT FIBERWIRE #2 38 T-5 BLUE (SUTURE) ×6
SUT MNCRL AB 4-0 PS2 18 (SUTURE) ×3 IMPLANT
SUT VIC AB 0 CT2 27 (SUTURE) ×3 IMPLANT
SUT VIC AB 2-0 CT1 27 (SUTURE) ×3
SUT VIC AB 2-0 CT1 TAPERPNT 27 (SUTURE) ×1 IMPLANT
SUT VICRYL 0 CT 1 36IN (SUTURE) ×3 IMPLANT
SUTURE FIBERWR #2 38 T-5 BLUE (SUTURE) ×2 IMPLANT
SYR CONTROL 10ML LL (SYRINGE) ×3 IMPLANT
SYR TB 1ML LUER SLIP (SYRINGE) ×2 IMPLANT
TOWEL OR 17X24 6PK STRL BLUE (TOWEL DISPOSABLE) ×3 IMPLANT
TOWEL OR 17X26 10 PK STRL BLUE (TOWEL DISPOSABLE) ×3 IMPLANT
TOWER CARTRIDGE SMART MIX (DISPOSABLE) IMPLANT
WATER STERILE IRR 1000ML POUR (IV SOLUTION) ×3 IMPLANT
YANKAUER SUCT BULB TIP NO VENT (SUCTIONS) ×3 IMPLANT

## 2017-01-29 NOTE — Progress Notes (Signed)
Left Dr. Veverly Fells A message about patient's HR being in the 30's. Waiting for a callback.

## 2017-01-29 NOTE — Significant Event (Signed)
Rapid Response Event Note  Overview:  Called by RN Stanton Kidney with concerns with low HR Time Called: 1352 Arrival Time: 1400 Event Type: Cardiac  Initial Focused Assessment:  Arouses to name - awake and alert - oriented - warm and dry - denies CP, SOB, nausea, or dizziness.  Resps reg and unlabored bil BS present clear.  Right shoulder DDI. BP 115/52 RR 16 O2 sats 100% on 2 liter nasal cannula.  HR on pulse ox monitor showing 34 - 36.  Placed on tele box - initial 44 - having premature beats - HR up to 54-58.  Remains asx.     Interventions:  NS bolus 500 cc infusing on my arrival per order ortho team.  Notes from OR and PACU reviewed. Pre-op HR 60 - patient on BB.   Spoke with RN from PACU who states HR remained in the 50's post op.   Placed on tele box - initial 44 - having premature beats - HR up to 54-58. SB with first degree HB - with PVC's noted - consistent with previous 12 lead in EPIC.   Remains asx.  Dr. Veverly Fells to bedside - reviewed tele box. MD Spoke with patient and family with updates.  Patient remains asx.       Plan of Care (if not transferred): Place on telemetry.  RN to call as needed. Will follow today.    Event Summary: Name of Physician Notified: Dr Hassell Done at  (pta RRT)  Name of Consulting Physician Notified: Dr Martinique at  (per Dr. Veverly Fells)  Outcome: Stayed in room and stabalized (addded telemetry)  Event End Time: [place]

## 2017-01-29 NOTE — Transfer of Care (Signed)
Immediate Anesthesia Transfer of Care Note  Patient: Kristopher Ayers  Procedure(s) Performed: Procedure(s): REVERSE RIGHT SHOULDER ARTHROPLASTY (Right)  Patient Location: PACU  Anesthesia Type:GA combined with regional for post-op pain  Level of Consciousness: awake, alert  and oriented  Airway & Oxygen Therapy: Patient Spontanous Breathing and Patient connected to face mask oxygen  Post-op Assessment: Report given to RN, Post -op Vital signs reviewed and stable and Patient moving all extremities  Post vital signs: Reviewed and stable  Last Vitals:  Vitals:   01/29/17 0930 01/29/17 1204  BP: (!) 146/42   Pulse:    Resp: (!) 25   Temp:  36.6 C    Last Pain:  Vitals:   01/29/17 0846  TempSrc: Oral         Complications: No apparent anesthesia complications

## 2017-01-29 NOTE — Progress Notes (Signed)
Dr Veverly Fells came up to floor.  Placed patient on telemetry.  Pulse ox was not reading pulse correctly.  Patient's pulse is staying in the 60's.

## 2017-01-29 NOTE — Progress Notes (Signed)
Orthopedics Progress Note  Subjective: Patient reports feeling fine.  No SOB or chest pain. Rapid response team called due to bradycardia on the ortho floor. No symptoms  Objective:  Vitals:   01/29/17 1330 01/29/17 1345  BP: (!) 115/52   Pulse: (!) 55 (!) 32  Resp: 12   Temp: 97.5 F (36.4 C)     General: Awake and alert  Musculoskeletal: Right shoulder dressing CDI Neurovascularly intact  Lab Results  Component Value Date   WBC 6.5 01/21/2017   HGB 13.1 01/21/2017   HCT 40.3 01/21/2017   MCV 96.0 01/21/2017   PLT 158 01/21/2017       Component Value Date/Time   NA 136 01/21/2017 1519   K 4.7 01/21/2017 1519   CL 105 01/21/2017 1519   CO2 22 01/21/2017 1519   GLUCOSE 104 (H) 01/21/2017 1519   BUN 39 (H) 01/21/2017 1519   CREATININE 1.53 (H) 01/21/2017 1519   CALCIUM 9.0 01/21/2017 1519   GFRNONAA 39 (L) 01/21/2017 1519   GFRAA 45 (L) 01/21/2017 1519    No results found for: INR, PROTIME  Assessment/Plan: POD #1 s/p Procedure(s): REVERSE RIGHT SHOULDER ARTHROPLASTY Patient with post op bradycardia but maintained blood pressure,  Will transfer patient to tele bed still on 5N Will ask Dr Peter Martinique patient's cardiologist or one of his partners to see Mr Kristopher Ayers in consult.  PT, OT tomorrow. D/C planning.  Doran Heater. Veverly Fells, MD 01/29/2017 2:23 PM

## 2017-01-29 NOTE — Anesthesia Preprocedure Evaluation (Addendum)
Anesthesia Evaluation  Patient identified by MRN, date of birth, ID band Patient awake    Reviewed: Allergy & Precautions, NPO status , Patient's Chart, lab work & pertinent test results  Airway Mallampati: II   Neck ROM: Full    Dental   Pulmonary former smoker,    breath sounds clear to auscultation       Cardiovascular hypertension, + CAD and + Cardiac Stents   Rhythm:Regular Rate:Normal     Neuro/Psych    GI/Hepatic   Endo/Other    Renal/GU      Musculoskeletal  (+) Arthritis ,   Abdominal   Peds  Hematology   Anesthesia Other Findings   Reproductive/Obstetrics                             Anesthesia Physical Anesthesia Plan  ASA: III  Anesthesia Plan: General   Post-op Pain Management:  Regional for Post-op pain   Induction: Intravenous  Airway Management Planned: Oral ETT  Additional Equipment:   Intra-op Plan:   Post-operative Plan: Extubation in OR  Informed Consent: I have reviewed the patients History and Physical, chart, labs and discussed the procedure including the risks, benefits and alternatives for the proposed anesthesia with the patient or authorized representative who has indicated his/her understanding and acceptance.   Dental advisory given  Plan Discussed with:   Anesthesia Plan Comments:         Anesthesia Quick Evaluation

## 2017-01-29 NOTE — Anesthesia Postprocedure Evaluation (Signed)
Anesthesia Post Note  Patient: Kristopher Ayers  Procedure(s) Performed: Procedure(s) (LRB): REVERSE RIGHT SHOULDER ARTHROPLASTY (Right)     Patient location during evaluation: PACU Anesthesia Type: General and Regional Level of consciousness: awake and alert Pain management: pain level controlled Vital Signs Assessment: post-procedure vital signs reviewed and stable Respiratory status: spontaneous breathing, nonlabored ventilation, respiratory function stable and patient connected to nasal cannula oxygen Cardiovascular status: blood pressure returned to baseline and stable Postop Assessment: no signs of nausea or vomiting Anesthetic complications: no    Last Vitals:  Vitals:   01/29/17 1245 01/29/17 1252  BP:  (!) 120/48  Pulse: 68 (!) 32  Resp: 18 17  Temp:      Last Pain:  Vitals:   01/29/17 1252  TempSrc:   PainSc: 0-No pain                 Madisyn Mawhinney,JAMES TERRILL

## 2017-01-29 NOTE — Anesthesia Procedure Notes (Signed)
Procedure Name: Intubation Date/Time: 01/29/2017 10:07 AM Performed by: Rush Farmer E Pre-anesthesia Checklist: Patient identified, Emergency Drugs available, Suction available and Patient being monitored Patient Re-evaluated:Patient Re-evaluated prior to inductionOxygen Delivery Method: Circle system utilized Preoxygenation: Pre-oxygenation with 100% oxygen Intubation Type: IV induction Ventilation: Mask ventilation without difficulty and Oral airway inserted - appropriate to patient size Laryngoscope Size: Glidescope and 4 Tube type: Oral Tube size: 7.5 mm Number of attempts: 1 Airway Equipment and Method: Video-laryngoscopy Placement Confirmation: ETT inserted through vocal cords under direct vision,  positive ETCO2 and breath sounds checked- equal and bilateral Secured at: 22 cm Tube secured with: Tape Dental Injury: Teeth and Oropharynx as per pre-operative assessment  Comments: DL x1 with MAC 4 unable to visualize vocal cords per CRNA. DL x1 with glidescope, Grade I view on screen. AOI using glidescope per MDA.

## 2017-01-29 NOTE — Brief Op Note (Signed)
01/29/2017  12:14 PM  PATIENT:  Kristopher Ayers  83 y.o. male  PRE-OPERATIVE DIAGNOSIS:  Right shoulder rotator cuff arthropathy  POST-OPERATIVE DIAGNOSIS:  Right shoulder rotator cuff arthropathy  PROCEDURE:  Procedure(s): REVERSE RIGHT SHOULDER ARTHROPLASTY (Right) DePuy Delta Xtend  SURGEON:  Surgeon(s) and Role:    Netta Cedars, MD - Primary  PHYSICIAN ASSISTANT:   ASSISTANTS: Ventura Bruns, PA-C   ANESTHESIA:   regional and general  EBL:  Total I/O In: 1300 [I.V.:1300] Out: 300 [Blood:300]  BLOOD ADMINISTERED:none  DRAINS: none   LOCAL MEDICATIONS USED:  MARCAINE     SPECIMEN:  No Specimen  DISPOSITION OF SPECIMEN:  N/A  COUNTS:  YES  TOURNIQUET:  * No tourniquets in log *  DICTATION: .Other Dictation: Dictation Number [id number]  PLAN OF CARE: Admit to inpatient   PATIENT DISPOSITION:  PACU - hemodynamically stable.   Delay start of Pharmacological VTE agent (>24hrs) due to surgical blood loss or risk of bleeding: not applicable

## 2017-01-29 NOTE — Consult Note (Signed)
Cardiology Consultation:   Patient ID: Kristopher Ayers; [id number]; [birth year]   Admit date: 01/29/2017 Date of Consult: 01/29/2017  Primary Care Provider: Lilian Coma., MD Primary Cardiologist: [place]  Primary Electrophysiologist:     Patient Profile:   Kristopher Ayers is a 83 y.o. male with a hx of CAD, CABG, HTN, hyperlipidemia  who is being seen today for the evaluation of bradycardia / frequent PVCs  at the request of Dr. Veverly Fells .  History of Present Illness:   Kristopher Ayers is an 83 yo with hx of CAD admitted on June 1  for shoulder surgery.  He was found to have a HR of 30-40s by nurse. Further evaluation revealed that he was having frequent PVCs in a bigeminal pattern Kristopher Ayers has not having any CP , dyspnea, lightheadedness  He lives with his wife.  Is not nearly as active as he used to be .  Past Medical History:  Diagnosis Date  . CAD (coronary artery disease)    CABG 2001; DES to Cx and OM1 09/18/15  . CKD (chronic kidney disease)   . COPD (chronic obstructive pulmonary disease) [place])   . Diverticulosis    patient denies  . DJD (degenerative joint disease)   . Hyperlipidemia   . Hypertension   . Internal hemorrhoids    patient denies  . Kidney stone     Past Surgical History:  Procedure Laterality Date  . ANKLE FUSION    . APPENDECTOMY    . CHOLECYSTECTOMY    . COLONOSCOPY    . CORONARY ARTERY BYPASS GRAFT  2001  . INGUINAL HERNIA REPAIR    . LEG SURGERY    . LUMBAR LAMINECTOMY     x3  . ROTATOR CUFF REPAIR     x 2  . TONSILLECTOMY AND ADENOIDECTOMY    . TOTAL KNEE ARTHROPLASTY     left     Inpatient Medications: Scheduled Meds: . [START ON 01/30/2017] allopurinol  100 mg Oral Daily  . [START ON 01/30/2017] aspirin  81 mg Oral Daily  . [START ON 01/30/2017] chlorthalidone  12.5 mg Oral Daily  . docusate sodium  100 mg Oral BID  . DULoxetine  60 mg Oral Daily  . [START ON 01/30/2017] fluticasone  1 spray Each Nare Daily  . fluticasone  furoate-vilanterol  1 puff Inhalation Daily  . glycopyrrolate      . [START ON 01/30/2017] irbesartan  75 mg Oral Daily  . metoprolol succinate  25 mg Oral QPM  . midazolam      . pravastatin  40 mg Oral q1800   Continuous Infusions: . sodium chloride 50 mL/hr at 01/29/17 1553  .  ceFAZolin (ANCEF) IV 2 g (01/29/17 1552)  . lactated ringers 10 mL/hr at 01/29/17 0841  . methocarbamol (ROBAXIN)  IV    . sodium chloride     PRN Meds: acetaminophen **OR** acetaminophen, ipratropium, menthol-cetylpyridinium **OR** phenol, methocarbamol **OR** methocarbamol (ROBAXIN)  IV, metoCLOPramide **OR** metoCLOPramide (REGLAN) injection, ondansetron **OR** ondansetron (ZOFRAN) IV, oxyCODONE, polyethylene glycol, traMADol  Allergies:    Allergies  Allergen Reactions  . Irbesartan Cough    WEAKNESS  . Nsaids Rash and Other (See Comments)    RENAL INSUFFICIENCY  . Amlodipine Swelling    SWELLING REACTION UNSPECIFIED   . Buprenorphine Hcl     UNSPECIFIED REACTION   . Diazepam     UNSPECIFIED REACTION    . Hydrocodone-Acetaminophen Rash  . Morphine And Related Rash    Social  History:   Social History   Social History  . Marital status: Married    Spouse name: N/A  . Number of children: 3  . Years of education: N/A   Occupational History  . Retired [employer]   Social History Main Topics  . Smoking status: Former Research scientist (life sciences)  . Smokeless tobacco: Never Used  . Alcohol use No  . Drug use: No  . Sexual activity: Not on file   Other Topics Concern  . Not on file   Social History Narrative   Daily caffeine     Family History:   The patient's family history includes CAD in his brother; Congestive Heart Failure in his father. There is no history of Colon cancer.  ROS:  Please see the history of present illness.  ROS  All other ROS reviewed and negative.     Physical Exam/Data:   Vitals:   01/29/17 1345 01/29/17 1451 01/29/17 1500 01/29/17 1557  BP:      Pulse: (!) 32 (!) 57  (!) 43 64  Resp:   18   Temp:      TempSrc:      SpO2:   97%   Weight:      Height:        Intake/Output Summary (Last 24 hours) at 01/29/17 1657 Last data filed at 01/29/17 1303  Gross per 24 hour  Intake             1470 ml  Output              300 ml  Net             1170 ml   Filed Weights   01/29/17 0832  Weight: 206 lb (93.4 kg)   Body mass index is 35.36 kg/m.  General:   Elderly man,  Shoulder in a sling and bandaged.  HEENT: normal Lymph: no adenopathy Neck: no JVD Endocrine:  No thryomegaly Vascular: no carotid bruits Cardiac:  Regularly irregular in a bigeminal pattern .  Lungs:  Clear  Abd:  + BS , NT  Ext: no edema Musculoskeletal:   S/p right shoulder surgery  Skin: warm and dry  Neuro:  CNs 2-12 intact, no focal abnormalities noted Psych:  Normal affect    EKG:  NSR with bigeminy  Relevant CV Studies:   Laboratory Data:  ChemistryNo results for input(s): NA, K, CL, CO2, GLUCOSE, BUN, CREATININE, CALCIUM, GFRNONAA, GFRAA, ANIONGAP in the last 168 hours.  No results for input(s): PROT, ALBUMIN, AST, ALT, ALKPHOS, BILITOT in the last 168 hours. HematologyNo results for input(s): WBC, RBC, HGB, HCT, MCV, MCH, MCHC, RDW, PLT in the last 168 hours. Cardiac EnzymesNo results for input(s): TROPONINI in the last 168 hours. No results for input(s): TROPIPOC in the last 168 hours.  BNPNo results for input(s): BNP, PROBNP in the last 168 hours.  DDimer No results for input(s): DDIMER in the last 168 hours.  Radiology/Studies:  Dg Shoulder Right Port  Result Date: 01/29/2017 CLINICAL DATA:  Status post right shoulder replacement. EXAM: PORTABLE RIGHT SHOULDER COMPARISON:  Radiographs of same day. FINDINGS: The glenoid and humeral components appear to be well situated. No fracture or dislocation is noted. Expected postoperative changes are seen in the surrounding soft tissues. IMPRESSION: Status post right shoulder arthroplasty. Electronically Signed   By: Marijo Conception, M.D.   On: 01/29/2017 14:09   Dg Shoulder Right Port  Result Date: 01/29/2017 CLINICAL DATA:  Preoperative exam for shoulder arthroplasty.  EXAM: PORTABLE RIGHT SHOULDER COMPARISON:  12/16/2015 FINDINGS: Two views show advanced osteoarthritis of the glenohumeral joint with joint space narrowing and marginal osteophytes. Moderate narrowing of the humeral acromial distance. Previous distal clavicular resection. Regional ribs appear normal. IMPRESSION: Chronic osteoarthritis of the glenohumeral joint. Electronically Signed   By: Nelson Chimes M.D.   On: 01/29/2017 10:20    Assessment and Plan:   1. Bradycardia:   The measured HR of 35 is actually 1/2 of his actual HR.   He is having ventricular bigeminy and the PVCs are not producing as much of a pulse pressure.  He denies any syncope or presyncope. No CP   I suspect these PVCs are related to the anesthesia and will improve as the anesthesia wears off.  Potassium level was good on May 24.   Will draw BMP and magnesium level.  Will also get a TSH    Signed, Mertie Moores, MD  01/29/2017 4:57 PM

## 2017-01-29 NOTE — Anesthesia Procedure Notes (Addendum)
Anesthesia Regional Block: Interscalene brachial plexus block   Pre-Anesthetic Checklist: ,, timeout performed, Correct Patient, Correct Site, Correct Laterality, Correct Procedure, Correct Position, site marked, Risks and benefits discussed,  Surgical consent,  Pre-op evaluation,  At surgeon's request and post-op pain management  Laterality: Upper and Right  Prep: Betadine, chloraprep, alcohol swabs       Needles:  Injection technique: Single-shot  Needle Type: Stimulator Needle - 40     Needle Length: 4cm  Needle Gauge: 22   Needle insertion depth: 3 cm   Additional Needles:   Procedures: ultrasound guided, nerve stimulator,,,,,,   Nerve Stimulator or Paresthesia:  Response: Twitch elicited, 0.5 mA, 0.3 ms,   Additional Responses:   Narrative:  Start time: 01/29/2017 8:55 AM End time: 01/29/2017 9:15 AM Injection made incrementally with aspirations every 5 mL.  Performed by: Personally  Anesthesiologist: Genella Bas  Additional Notes: Block assessed prior to start of surgery, brie f period of bradycardia Rxd c Robinol

## 2017-01-29 NOTE — Interval H&P Note (Signed)
History and Physical Interval Note:  01/29/2017 9:16 AM  Kristopher Ayers  has presented today for surgery, with the diagnosis of Right shoulder rotator cuff arthropathy  The various methods of treatment have been discussed with the patient and family. After consideration of risks, benefits and other options for treatment, the patient has consented to  Procedure(s): REVERSE RIGHT SHOULDER ARTHROPLASTY (Right) as a surgical intervention .  The patient's history has been reviewed, patient examined, no change in status, stable for surgery.  I have reviewed the patient's chart and labs.  Questions were answered to the patient's satisfaction.     Roshan Roback,STEVEN R

## 2017-01-30 DIAGNOSIS — I493 Ventricular premature depolarization: Secondary | ICD-10-CM

## 2017-01-30 DIAGNOSIS — Z96611 Presence of right artificial shoulder joint: Secondary | ICD-10-CM

## 2017-01-30 DIAGNOSIS — I2510 Atherosclerotic heart disease of native coronary artery without angina pectoris: Secondary | ICD-10-CM

## 2017-01-30 LAB — BASIC METABOLIC PANEL
ANION GAP: 11 (ref 5–15)
BUN: 36 mg/dL — AB (ref 6–20)
CHLORIDE: 106 mmol/L (ref 101–111)
CO2: 21 mmol/L — AB (ref 22–32)
Calcium: 8.3 mg/dL — ABNORMAL LOW (ref 8.9–10.3)
Creatinine, Ser: 1.59 mg/dL — ABNORMAL HIGH (ref 0.61–1.24)
GFR calc Af Amer: 43 mL/min — ABNORMAL LOW (ref >60)
GFR calc non Af Amer: 37 mL/min — ABNORMAL LOW (ref >60)
GLUCOSE: 161 mg/dL — AB (ref 65–99)
POTASSIUM: 4.8 mmol/L (ref 3.5–5.1)
Sodium: 138 mmol/L (ref 135–145)

## 2017-01-30 LAB — HEMOGLOBIN AND HEMATOCRIT, BLOOD
HCT: 37 % — ABNORMAL LOW (ref 39.0–52.0)
Hemoglobin: 11.7 g/dL — ABNORMAL LOW (ref 13.0–17.0)

## 2017-01-30 NOTE — Progress Notes (Signed)
Occupational Therapy Treatment Patient Details Name: KHAREE LESESNE MRN: [id number] DOB: 07-06-1926 Today's Date: 01/30/2017    History of present illness Pt is an 83 y.o. male s/p R reverse TSA. PMHx: CAD, COPD, HTN, Ankle fusion, CABG, Lumbar laminectomy x3, Rotator cuff repair x2, L TKA.   OT comments  Pt seen as co-tx with PT to progress mobility and determine use of appropriate AD for mobility upon return home. Pt able to perform functional mobility out of room into hallway with min assist +2 for safety with use of cane in L hand. Pt with difficulty using cane appropriately for support and balance, essentially carrying cane with him as he walked. Pt with major LOB x1 requiring max assist to correct due to ?R knee buckling. Per daughter, pts wife had recent hip sx and cannot assist physically with pts mobility upon return home. Pt did tolerate RUE exercises well but does need continued education. Updated d/c recommendations to SNF for follow up to maximize independence and safety with ADL and functional mobility prior to return home; at this time feel pt is a very high fall risk and is not safe to d/c home with wife assist. Will continue to follow acutely.   Follow Up Recommendations  SNF;Supervision/Assistance - 24 hour    Equipment Recommendations  3 in 1 bedside commode    Recommendations for Other Services PT consult    Precautions / Restrictions Precautions Precautions: Fall;Shoulder Type of Shoulder Precautions: Active protocol: AROM elbow/wrist/hand OK. A/PROM shoulder: FF 90, ABD 60, ER 30. Shoulder Interventions: Shoulder sling/immobilizer;For comfort (and for sleep) Precaution Comments: Reviewed precautions with pt and family Required Braces or Orthoses: Sling Restrictions Weight Bearing Restrictions: Yes RUE Weight Bearing: Non weight bearing       Mobility Bed Mobility Overal bed mobility: Needs Assistance Bed Mobility: Supine to Sit     Supine to sit: Min  assist     General bed mobility comments: assist for trunk elevation; exiting toward L side  Transfers Overall transfer level: Needs assistance Equipment used: Straight cane Transfers: Sit to/from Stand Sit to Stand: Min assist;+2 safety/equipment         General transfer comment: Min assist for stability with cane    Balance Overall balance assessment: Needs assistance Sitting-balance support: Feet supported;No upper extremity supported Sitting balance-Leahy Scale: Good     Standing balance support: Single extremity supported Standing balance-Leahy Scale: Poor Standing balance comment: major LOB x1 with mobility (?due to R knee buckling), required max assist to correct                           ADL either performed or assessed with clinical judgement   ADL Overall ADL's : Needs assistance/impaired                         Toilet Transfer: Minimal assistance;+2 for safety/equipment;Ambulation (cane) Toilet Transfer Details (indicate cue type and reason): Pt with major LOB x1 requiring max assist to correct; LOB due to R knee buckling?         Functional mobility during ADLs: Minimal assistance;+2 for safety/equipment;Cane (max assist for correction of LOB) General ADL Comments: Discussed potential for post acute rehab following acute stay with pt and family; they would prefer for pt to d/c home with [facility] services however at this time pt is too unsteady on his feet for safe d/c home with wife assisting; pt is a high fall  risk.     Vision       Perception     Praxis      Cognition Arousal/Alertness: Awake/alert Behavior During Therapy: WFL for tasks assessed/performed Overall Cognitive Status: Within Functional Limits for tasks assessed                                          Exercises Exercises: Shoulder Shoulder Exercises Shoulder Flexion: AAROM;Right;5 reps;Seated Shoulder ABduction: AAROM;Right;5 reps;Seated Shoulder  External Rotation: AAROM;Right;5 reps;Seated Elbow Flexion: AROM;Right;10 reps;Seated Wrist Flexion: AROM;Right;10 reps;Seated Digit Composite Flexion: AROM;Right;10 reps;Supine   Shoulder Instructions       General Comments      Pertinent Vitals/ Pain       Pain Assessment: Faces Faces Pain Scale: Hurts even more Pain Location: R shoulder Pain Descriptors / Indicators: Aching;Sore Pain Intervention(s): Limited activity within patient's tolerance;Monitored during session;Repositioned;Ice applied  Home Living                                          Prior Functioning/Environment              Frequency  Min 3X/week        Progress Toward Goals  OT Goals(current goals can now be found in the care plan section)  Progress towards OT goals: Progressing toward goals  Acute Rehab OT Goals Patient Stated Goal: return home OT Goal Formulation: With patient/family  Plan Discharge plan needs to be updated    Co-evaluation    PT/OT/SLP Co-Evaluation/Treatment: Yes Reason for Co-Treatment: For patient/therapist safety;To address functional/ADL transfers   OT goals addressed during session: ADL's and self-care;Strengthening/ROM      AM-PAC PT "6 Clicks" Daily Activity     Outcome Measure   Help from another person eating meals?: A Little Help from another person taking care of personal grooming?: A Lot Help from another person toileting, which includes using toliet, bedpan, or urinal?: A Lot Help from another person bathing (including washing, rinsing, drying)?: A Lot Help from another person to put on and taking off regular upper body clothing?: A Lot Help from another person to put on and taking off regular lower body clothing?: A Lot 6 Click Score: 13    End of Session Equipment Utilized During Treatment: Gait belt;Other (comment);Oxygen (sling, cane)  OT Visit Diagnosis: Unsteadiness on feet (R26.81);Other abnormalities of gait and mobility  (R26.89);Muscle weakness (generalized) (M62.81);Pain Pain - Right/Left: Right Pain - part of body: Shoulder   Activity Tolerance Patient tolerated treatment well   Patient Left in chair;with call bell/phone within reach;with family/visitor present   Nurse Communication Mobility status;Weight bearing status;Precautions;Other (comment) (pt with near fall, LOB with R knee buckling)        Time: 2774-1287 OT Time Calculation (min): 33 min  Charges: OT General Charges $OT Visit: 1 Procedure OT Treatments $Therapeutic Activity: 8-22 mins  Drequan Ironside A. Ulice Brilliant, M.S., OTR/L Pager: [place] 01/30/2017, 1:45 PM

## 2017-01-30 NOTE — Evaluation (Signed)
Physical Therapy Evaluation Patient Details Name: Kristopher Ayers MRN: [id number] DOB: [birth year] Today's Date: 01/30/2017   History of Present Illness  Pt is an 83 y.o. male s/p R reverse TSA. PMHx: CAD, COPD, HTN, Ankle fusion, CABG, Lumbar laminectomy x3, Rotator cuff repair x2, L TKA.  Clinical Impression  Patient is s/p above surgery resulting in functional limitations due to the deficits listed below (see PT Problem List).Pt is currently minAx2 for bed mobility, transfers with RW and ambulation of 50 feet with use of cane in L hand. Pt has difficulty using cane in L UE to support deficits in strength and ROM in his R LE, essentially just carrying it with him as he walked. Pt took standing rest break after 50 feet of ambulation and as he was standing he experienced major LoB due to R knee buckling requiring maxA to steady. Pt was not able to utilize cane in L UE to aid in his own steadying. Per daughter, pt wife has just recently had hip surgery. PT feels she would have difficulty in assisting in maintaining safe mobilization of her husband in their home environment. At this time pt is high fall risk and is not safe to d/c home with wife assist. PT currently recommends d/c to SNF for safety.    Follow Up Recommendations SNF;Supervision/Assistance - 24 hour    Equipment Recommendations  None recommended by PT    Recommendations for Other Services OT consult     Precautions / Restrictions Precautions Precautions: Fall;Shoulder Type of Shoulder Precautions: Active protocol: AROM elbow/wrist/hand OK. A/PROM shoulder: FF 90, ABD 60, ER 30. Shoulder Interventions: Shoulder sling/immobilizer;For comfort (and for sleep) Precaution Booklet Issued: Yes (comment) Precaution Comments: Reviewed precautions with pt and family Required Braces or Orthoses: Sling Restrictions Weight Bearing Restrictions: Yes RUE Weight Bearing: Non weight bearing      Mobility  Bed Mobility Overal bed mobility:  Needs Assistance Bed Mobility: Supine to Sit     Supine to sit: Min assist     General bed mobility comments: assist for trunk elevation; exiting toward L side  Transfers Overall transfer level: Needs assistance Equipment used: Straight cane Transfers: Sit to/from Stand Sit to Stand: Min assist;+2 safety/equipment         General transfer comment: Min assist for stability with cane  Ambulation/Gait Ambulation/Gait assistance: Min assist;+2 physical assistance Ambulation Distance (Feet): 50 Feet Assistive device: Straight cane Gait Pattern/deviations: Step-through pattern;Decreased dorsiflexion - right;Decreased stance time - right;Antalgic;Trunk flexed Gait velocity: slowed  Gait velocity interpretation: Below normal speed for age/gender General Gait Details: decreased pt normally uses cane in R UE and does not have good sequencing with use in L UE despite vc for proper usage        Balance Overall balance assessment: Needs assistance Sitting-balance support: Feet supported;No upper extremity supported Sitting balance-Leahy Scale: Good     Standing balance support: Single extremity supported Standing balance-Leahy Scale: Poor Standing balance comment: major LOB secondary to R knee buckling requiring maxA to steady.                              Pertinent Vitals/Pain Pain Assessment: Faces Faces Pain Scale: Hurts even more Pain Location: R shoulder and R knee Pain Descriptors / Indicators: Aching;Sore Pain Intervention(s): Limited activity within patient's tolerance;Monitored during session  BP 109/39 seated after activity    Home Living Family/patient expects to be discharged to:: Private residence Living Arrangements: Spouse/significant other  Available Help at Discharge: Family;Available 24 hours/day Type of Home: House Home Access: Stairs to enter   [employer] of Steps: 1 Home Layout: One level Home Equipment: Walker - 2  wheels;Walker - 4 wheels      Prior Function Level of Independence: Needs assistance   Gait / Transfers Assistance Needed: RW for all mobility  ADL's / Homemaking Assistance Needed: Wife assisting with sponge bathing and dressing        Hand Dominance   Dominant Hand: Right    Extremity/Trunk Assessment   Upper Extremity Assessment Upper Extremity Assessment: Defer to OT evaluation RUE Deficits / Details: Active movement noted distally, will further assess next session    Lower Extremity Assessment Lower Extremity Assessment: RLE deficits/detail RLE Deficits / Details: R LE grossly 3+/5 MMT  however pt experinces spasms with increased resistance of MMT especially with knee extension and flexion, decreased ankle ROM secondary to prior fusion, and knee extension lacking approximately 10 degrees, RLE Sensation:  (intact)    Cervical / Trunk Assessment Cervical / Trunk Assessment: Kyphotic  Communication   Communication: HOH (has hearing aids)  Cognition Arousal/Alertness: Awake/alert Behavior During Therapy: WFL for tasks assessed/performed Overall Cognitive Status: Within Functional Limits for tasks assessed                                           Exercises Shoulder Exercises Shoulder Flexion: AAROM;Right;5 reps;Seated Shoulder ABduction: AAROM;Right;5 reps;Seated Shoulder External Rotation: AAROM;Right;5 reps;Seated Elbow Flexion: AROM;Right;10 reps;Seated Wrist Flexion: AROM;Right;10 reps;Seated Digit Composite Flexion: AROM;Right;10 reps;Supine Donning/doffing shirt without moving shoulder: Maximal assistance (educated pt) Donning/doffing sling/immobilizer: Maximal assistance (educated pt) Correct positioning of sling/immobilizer: Maximal assistance (educated pt) Sling wearing schedule (on at all times/off for ADL's): Supervision/safety (eduated pt)   Assessment/Plan    PT Assessment Patient needs continued PT services  PT Problem List  Decreased strength;Decreased range of motion;Decreased activity tolerance;Decreased balance;Decreased knowledge of use of DME;Decreased safety awareness;Decreased mobility;Pain       PT Treatment Interventions DME instruction;Gait training;Functional mobility training;Therapeutic activities;Therapeutic exercise;Balance training;Patient/family education    PT Goals (Current goals can be found in the Care Plan section)  Acute Rehab PT Goals Patient Stated Goal: return home PT Goal Formulation: With patient Time For Goal Achievement: 02/13/17 Potential to Achieve Goals: Good    Frequency Min 3X/week    Co-evaluation PT/OT/SLP Co-Evaluation/Treatment: Yes Reason for Co-Treatment: For patient/therapist safety PT goals addressed during session: Mobility/safety with mobility;Proper use of DME OT goals addressed during session: ADL's and self-care;Strengthening/ROM       AM-PAC PT "6 Clicks" Daily Activity  Outcome Measure Difficulty turning over in bed (including adjusting bedclothes, sheets and blankets)?: Total Difficulty moving from lying on back to sitting on the side of the bed? : Total Difficulty sitting down on and standing up from a chair with arms (e.g., wheelchair, bedside commode, etc,.)?: Total Help needed moving to and from a bed to chair (including a wheelchair)?: A Little Help needed walking in hospital room?: A Lot Help needed climbing 3-5 steps with a railing? : Total 6 Click Score: 9    End of Session Equipment Utilized During Treatment: Gait belt Activity Tolerance: Patient limited by fatigue;Other (comment) (Right LE weakness) Patient left: in chair;with call bell/phone within reach;with family/visitor present Nurse Communication: Mobility status;Weight bearing status;Precautions PT Visit Diagnosis: Unsteadiness on feet (R26.81);Other abnormalities of gait and mobility (R26.89);Muscle weakness (generalized) (M62.81);Difficulty  in walking, not elsewhere classified  (R26.2);Pain Pain - Right/Left: Right Pain - part of body: Shoulder;Knee    Time: 1410-3013 PT Time Calculation (min) (ACUTE ONLY): 30 min   Charges:   PT Evaluation $PT Eval Low Complexity: 1 Procedure     PT G Codes:        Severiano Utsey B. Migdalia Dk PT, DPT Acute Rehabilitation  [phone] Pager [phone]    [place] 01/30/2017, 3:27 PM

## 2017-01-30 NOTE — Progress Notes (Addendum)
Progress Note  Patient Name: Kristopher Ayers Date of Encounter: 01/30/2017  Primary Cardiologist: [place]  Subjective   No CP, no SOB. No sensation of PVC's Very sleepy. Hard to stay awake.   Inpatient Medications    Scheduled Meds: . allopurinol  100 mg Oral Daily  . aspirin  81 mg Oral Daily  . chlorthalidone  12.5 mg Oral Daily  . docusate sodium  100 mg Oral BID  . DULoxetine  60 mg Oral Daily  . fluticasone  1 spray Each Nare Daily  . fluticasone furoate-vilanterol  1 puff Inhalation Daily  . irbesartan  75 mg Oral Daily  . metoprolol succinate  25 mg Oral QPM  . pravastatin  40 mg Oral q1800   Continuous Infusions: . sodium chloride 50 mL/hr at 01/29/17 2351  . lactated ringers 10 mL/hr at 01/29/17 0841   PRN Meds: acetaminophen **OR** acetaminophen, ipratropium, menthol-cetylpyridinium **OR** phenol, metoCLOPramide **OR** metoCLOPramide (REGLAN) injection, ondansetron **OR** ondansetron (ZOFRAN) IV, polyethylene glycol, traMADol   Vital Signs    Vitals:   01/29/17 1925 01/30/17 0018 01/30/17 0354 01/30/17 1100  BP: (!) 134/46 (!) 124/41 (!) 118/54   Pulse: (!) 34 100 72   Resp:      Temp: 97.5 F (36.4 C) 98.3 F (36.8 C) 98 F (36.7 C)   TempSrc: Oral Oral Oral   SpO2: 95% 98% 96% 96%  Weight:      Height:        Intake/Output Summary (Last 24 hours) at 01/30/17 1202 Last data filed at 01/30/17 1000  Gross per 24 hour  Intake          2185.83 ml  Output              350 ml  Net          1835.83 ml   Filed Weights   01/29/17 0832  Weight: 206 lb (93.4 kg)    Telemetry    NSR with frequent PVC's occasional bigem and trigem pattern - Personally Reviewed  ECG    Same as above NSR with PVC - Personally Reviewed  Physical Exam   GEN: Elderly No acute distress.  Snoring but awakens for questioning then goes back to sleep Neck: No JVD Cardiac: RRR, no murmurs, rubs, or gallops. Occasional ectopy Respiratory: Clear to auscultation  bilaterally. GI: Soft, nontender, non-distended  MS: No edema; No deformity. Right shoulder sling Neuro:  Nonfocal  Psych: Normal affect - tired, sleepy  Labs    Chemistry Recent Labs Lab 01/29/17 1744 01/30/17 0634  NA 139 138  K 5.0 4.8  CL 108 106  CO2 23 21*  GLUCOSE 130* 161*  BUN 36* 36*  CREATININE 1.45* 1.59*  CALCIUM 8.6* 8.3*  GFRNONAA 41* 37*  GFRAA 48* 43*  ANIONGAP 8 11     Hematology Recent Labs Lab 01/29/17 1744 01/30/17 0634  WBC 9.9  --   RBC 3.83*  --   HGB 12.0* 11.7*  HCT 37.5* 37.0*  MCV 97.9  --   MCH 31.3  --   MCHC 32.0  --   RDW 13.8  --   PLT 144*  --     Cardiac EnzymesNo results for input(s): TROPONINI in the last 168 hours. No results for input(s): TROPIPOC in the last 168 hours.   BNPNo results for input(s): BNP, PROBNP in the last 168 hours.   DDimer No results for input(s): DDIMER in the last 168 hours.   Radiology  Dg Shoulder Right Port  Result Date: 01/29/2017 CLINICAL DATA:  Status post right shoulder replacement. EXAM: PORTABLE RIGHT SHOULDER COMPARISON:  Radiographs of same day. FINDINGS: The glenoid and humeral components appear to be well situated. No fracture or dislocation is noted. Expected postoperative changes are seen in the surrounding soft tissues. IMPRESSION: Status post right shoulder arthroplasty. Electronically Signed   By: Marijo Conception, M.D.   On: 01/29/2017 14:09   Dg Shoulder Right Port  Result Date: 01/29/2017 CLINICAL DATA:  Preoperative exam for shoulder arthroplasty. EXAM: PORTABLE RIGHT SHOULDER COMPARISON:  12/16/2015 FINDINGS: Two views show advanced osteoarthritis of the glenohumeral joint with joint space narrowing and marginal osteophytes. Moderate narrowing of the humeral acromial distance. Previous distal clavicular resection. Regional ribs appear normal. IMPRESSION: Chronic osteoarthritis of the glenohumeral joint. Electronically Signed   By: Nelson Chimes M.D.   On: 01/29/2017 10:20     Cardiac Studies   ECG with PVC's bigem  Patient Profile     83 y.o. male post op right shoulder arthroplasty with post op PVC's, has CAD and chronic SOB  Assessment & Plan     - Continue metoprolol low dose  - should be benign PVC's  - no high risk symptoms  - Will see Dr. [place] in follow up or APP.   - Remember that BP cuff will not measure the PVC beat and will display bradycardia in the setting of Bigeminy. Mag and TSH OK.   - Consider ECHO as outpatient  - Dyspnea no change after stenting. Likely COPD.   Will sign off. Please call if ?  Signed, Candee Furbish, MD  01/30/2017, 12:02 PM

## 2017-01-30 NOTE — Evaluation (Signed)
Occupational Therapy Evaluation Patient Details Name: Kristopher Ayers MRN: [id number] DOB: [birth year] Today's Date: 01/30/2017    History of Present Illness Pt is an 83 y.o. male s/p R reverse TSA. PMHx: CAD, COPD, HTN, Ankle fusion, CABG, Lumbar laminectomy x3, Rotator cuff repair x2, L TKA.   Clinical Impression   Pt reports his wife was assisting with ADL PTA and he was using a RW for all mobility. Currently pt requires mod assist for stand pivot transfers with HHA and max assist overall for ADL. Began shoulder, ADL, and safety education with pt; however he would benefit from continued education with his wife present. Recommending HHOT for follow up to maximize independence and safety with ADL and functional mobility upon return home. Pt would benefit from continued skilled OT to address established goals.   Of note: At this time, do not feel pt is safe to d/c home with wife assist-would benefit from continued acute hospital stay. Plan to return for second OT session today to progress mobility, perform RUE exercises, and continue shoulder education with pt and family.     Follow Up Recommendations  Home health OT;Supervision/Assistance - 24 hour;DC plan and follow up therapy as arranged by surgeon    Equipment Recommendations  3 in 1 bedside commode    Recommendations for Other Services PT consult     Precautions / Restrictions Precautions Precautions: Fall;Shoulder Type of Shoulder Precautions: Active protocol: AROM elbow/wrist/hand OK. A/PROM shoulder: FF 90, ABD 60, ER 30. Shoulder Interventions: Shoulder sling/immobilizer;For comfort (and for sleep) Precaution Booklet Issued: Yes (comment) Required Braces or Orthoses: Sling Restrictions Weight Bearing Restrictions: Yes RUE Weight Bearing: Non weight bearing      Mobility Bed Mobility Overal bed mobility: Needs Assistance Bed Mobility: Supine to Sit     Supine to sit: Min assist;HOB elevated     General bed mobility  comments: Min assist for trunk elevation to sitting and HHA to scoot hips out to EOB.  Transfers Overall transfer level: Needs assistance Equipment used: 1 person hand held assist Transfers: Stand Pivot Transfers;Sit to/from Stand Sit to Stand: Min assist Stand pivot transfers: Mod assist       General transfer comment: Min assist for balance with sit to stand from EOB, HHA provided for stand pivot transfer EOB to chair. Pt very unsteady on feet with bil knee buckling noted during transfer     Balance Overall balance assessment: Needs assistance Sitting-balance support: Feet supported;No upper extremity supported Sitting balance-Leahy Scale: Good     Standing balance support: Single extremity supported Standing balance-Leahy Scale: Poor                             ADL either performed or assessed with clinical judgement   ADL Overall ADL's : Needs assistance/impaired Eating/Feeding: Minimal assistance;Sitting   Grooming: Moderate assistance;Sitting   Upper Body Bathing: Maximal assistance;Sitting   Lower Body Bathing: Maximal assistance;Sit to/from stand   Upper Body Dressing : Maximal assistance;Sitting Upper Body Dressing Details (indicate cue type and reason): for gown and sling Lower Body Dressing: Maximal assistance;Sit to/from stand   Toilet Transfer: Stand-pivot;BSC;Moderate assistance (HHA) Armed forces technical officer Details (indicate cue type and reason): Simulated by transfer from EOB>chair         Functional mobility during ADLs: Moderate assistance (HHA; stand pivot only) General ADL Comments: Began education on sling management and wear schedule, bed mobility technique, RUE NWB. SpO2 >88% on RA throughout; reapplied supplemental O2 at  end of session.     Vision Baseline Vision/History: Wears glasses       Perception     Praxis      Pertinent Vitals/Pain Pain Assessment: 0-10 Pain Score: 5  Pain Location: R shoulder Pain Descriptors /  Indicators: Aching;Sore Pain Intervention(s): Monitored during session;Repositioned;Ice applied     Hand Dominance Right   Extremity/Trunk Assessment Upper Extremity Assessment Upper Extremity Assessment: RUE deficits/detail RUE Deficits / Details: Active movement noted distally, will further assess next session RUE: Unable to fully assess due to immobilization   Lower Extremity Assessment Lower Extremity Assessment: Defer to PT evaluation   Cervical / Trunk Assessment Cervical / Trunk Assessment: Kyphotic   Communication Communication Communication: HOH (has hearing aids)   Cognition Arousal/Alertness: Awake/alert Behavior During Therapy: WFL for tasks assessed/performed Overall Cognitive Status: Within Functional Limits for tasks assessed                                     General Comments       Exercises Exercises: Shoulder   Shoulder Instructions Shoulder Instructions Donning/doffing shirt without moving shoulder: Maximal assistance (educated pt) Donning/doffing sling/immobilizer: Maximal assistance (educated pt) Correct positioning of sling/immobilizer: Maximal assistance (educated pt) Sling wearing schedule (on at all times/off for ADL's): Supervision/safety (eduated pt)    Home Living Family/patient expects to be discharged to:: Private residence Living Arrangements: Spouse/significant other Available Help at Discharge: Family;Available 24 hours/day Type of Home: House Home Access: Stairs to enter [employer] of Steps: 1   Home Layout: One level     Bathroom Shower/Tub: Teacher, early years/pre: Standard Bathroom Accessibility: No   Home Equipment: Environmental consultant - 2 wheels;Walker - 4 wheels          Prior Functioning/Environment Level of Independence: Needs assistance  Gait / Transfers Assistance Needed: RW for all mobility ADL's / Homemaking Assistance Needed: Wife assisting with sponge bathing and  dressing Communication / Swallowing Assistance Needed: HOH          OT Problem List: Decreased strength;Decreased range of motion;Decreased activity tolerance;Impaired balance (sitting and/or standing);Decreased knowledge of use of DME or AE;Decreased knowledge of precautions;Cardiopulmonary status limiting activity;Pain;Impaired UE functional use;Increased edema      OT Treatment/Interventions: Self-care/ADL training;Therapeutic exercise;Energy conservation;DME and/or AE instruction;Therapeutic activities;Patient/family education;Balance training    OT Goals(Current goals can be found in the care plan section) Acute Rehab OT Goals Patient Stated Goal: return home OT Goal Formulation: With patient Time For Goal Achievement: 02/13/17 Potential to Achieve Goals: Good ADL Goals Pt Will Perform Upper Body Bathing: with min assist;with caregiver independent in assisting;sitting Pt Will Perform Upper Body Dressing: with min assist;with caregiver independent in assisting;sitting Pt Will Transfer to Toilet: with min guard assist;ambulating;bedside commode Additional ADL Goal #1: Pt/caregiver will independently don/doff sling as precursor to ADL and mobility. Additional ADL Goal #2: Pt/caregiver will demonstrate independence with RUE ROM exercises.  OT Frequency: Min 3X/week   Barriers to D/C:            Co-evaluation              AM-PAC PT "6 Clicks" Daily Activity     Outcome Measure Help from another person eating meals?: A Little Help from another person taking care of personal grooming?: A Lot Help from another person toileting, which includes using toliet, bedpan, or urinal?: A Lot Help from another person bathing (including washing, rinsing, drying)?:  A Lot Help from another person to put on and taking off regular upper body clothing?: A Lot Help from another person to put on and taking off regular lower body clothing?: A Lot 6 Click Score: 13   End of Session Equipment  Utilized During Treatment: Other (comment);Oxygen (sling) Nurse Communication: Mobility status  Activity Tolerance: Patient tolerated treatment well Patient left: in chair;with call bell/phone within reach  OT Visit Diagnosis: Unsteadiness on feet (R26.81);Other abnormalities of gait and mobility (R26.89);Muscle weakness (generalized) (M62.81);Pain Pain - Right/Left: Right Pain - part of body: Shoulder                Time: 3382-5053 OT Time Calculation (min): 23 min Charges:  OT General Charges $OT Visit: 1 Procedure OT Evaluation $OT Eval Moderate Complexity: 1 Procedure OT Treatments $Self Care/Home Management : 8-22 mins G-Codes:     Mel Almond A. Ulice Brilliant, M.S., OTR/L Pager: [phone] 01/30/2017, 9:18 AM

## 2017-01-30 NOTE — Progress Notes (Signed)
Orthopedics Progress Note  Subjective: Patient nauseated this morning and last night. He has received Zofran and feels a little better now but no appetite  Objective:  Vitals:   01/30/17 0018 01/30/17 0354  BP: (!) 124/41 (!) 118/54  Pulse: 100 72  Resp:    Temp: 98.3 F (36.8 C) 98 F (36.7 C)    General: Awake and alert  Musculoskeletal: right shoulder dressing CDI, NVI including 5/5 EPL, grip and intrinsics, sensation intact Neurovascularly intact  Lab Results  Component Value Date   WBC 9.9 01/29/2017   HGB 11.7 (L) 01/30/2017   HCT 37.0 (L) 01/30/2017   MCV 97.9 01/29/2017   PLT 144 (L) 01/29/2017       Component Value Date/Time   NA 139 01/29/2017 1744   K 5.0 01/29/2017 1744   CL 108 01/29/2017 1744   CO2 23 01/29/2017 1744   GLUCOSE 130 (H) 01/29/2017 1744   BUN 36 (H) 01/29/2017 1744   CREATININE 1.45 (H) 01/29/2017 1744   CALCIUM 8.6 (L) 01/29/2017 1744   GFRNONAA 41 (L) 01/29/2017 1744   GFRAA 48 (L) 01/29/2017 1744    No results found for: INR, PROTIME  Assessment/Plan: POD #1 s/p Procedure(s): REVERSE RIGHT SHOULDER ARTHROPLASTY Appreciate very much the assistance of Cardiology and Dr Elmarie Shiley team!  Ventricular bigeminy responsible for the measured bradycardia. Mg (1.7) and TSH (1.269) within normal range. Patient remains stable from a blood pressure standpoint If Mr Dripps is feeling better and therapy goes well (cleared by PT, and OT) and home health arrangements including lift chair will consider D/C later today versus tomorrow.  Doran Heater. Veverly Fells, MD 01/30/2017 7:45 AM

## 2017-01-31 LAB — BASIC METABOLIC PANEL
ANION GAP: 10 (ref 5–15)
BUN: 42 mg/dL — ABNORMAL HIGH (ref 6–20)
CHLORIDE: 102 mmol/L (ref 101–111)
CO2: 24 mmol/L (ref 22–32)
Calcium: 8 mg/dL — ABNORMAL LOW (ref 8.9–10.3)
Creatinine, Ser: 2.2 mg/dL — ABNORMAL HIGH (ref 0.61–1.24)
GFR calc non Af Amer: 25 mL/min — ABNORMAL LOW (ref >60)
GFR, EST AFRICAN AMERICAN: 29 mL/min — AB (ref >60)
Glucose, Bld: 123 mg/dL — ABNORMAL HIGH (ref 65–99)
POTASSIUM: 4.2 mmol/L (ref 3.5–5.1)
SODIUM: 136 mmol/L (ref 135–145)

## 2017-01-31 MED ORDER — OMEPRAZOLE 2 MG/ML ORAL SUSPENSION: 40.0000 mg | Freq: Three times a day (TID) | ORAL | Status: DC | PRN

## 2017-01-31 MED ORDER — PANTOPRAZOLE SODIUM 20 MG PO TBEC
20.0000 mg | DELAYED_RELEASE_TABLET | Freq: Three times a day (TID) | ORAL | Status: DC | PRN
Start: 2017-01-31 — End: 2017-02-01
  Administered 2017-01-31 – 2017-02-01 (×2): 20 mg via ORAL
  Filled 2017-01-31 (×4): qty 1

## 2017-01-31 NOTE — Progress Notes (Signed)
Occupational Therapy Treatment Patient Details Name: Kristopher Ayers MRN: [id number] DOB: [birth year] Today's Date: 01/31/2017    History of present illness Pt is an 83 y.o. male s/p R reverse TSA. PMHx: CAD, COPD, HTN, Ankle fusion, CABG, Lumbar laminectomy x3, Rotator cuff repair x2, L TKA.   OT comments  Pt continues to demonstrate unsteadiness in standing with bil LE weakness; needed mod HHA for transfer EOB>chair. Pt making good progress with RUE AROM exercises; able to perform x10 each this morning with minimal increase in pain. Continue to feel pt is unsafe to d/c home given his balance deficits and high fall risk; recommending SNF for follow up. Will continue to follow acutely.   Follow Up Recommendations  SNF;Supervision/Assistance - 24 hour    Equipment Recommendations  3 in 1 bedside commode    Recommendations for Other Services      Precautions / Restrictions Precautions Precautions: Fall;Shoulder Type of Shoulder Precautions: Active protocol: AROM elbow/wrist/hand OK. A/PROM shoulder: FF 90, ABD 60, ER 30. Shoulder Interventions: Shoulder sling/immobilizer;For comfort (and sleep) Precaution Comments: Reviewed precautions with pt Required Braces or Orthoses: Sling Restrictions Weight Bearing Restrictions: Yes RUE Weight Bearing: Non weight bearing       Mobility Bed Mobility Overal bed mobility: Needs Assistance Bed Mobility: Supine to Sit     Supine to sit: Min assist     General bed mobility comments: Min hand held assist for trunk elevation into sitting with use of pts L hand. Exiting toward R side, pt able to maintain RUE NWB  Transfers Overall transfer level: Needs assistance Equipment used: 1 person hand held assist Transfers: Sit to/from [facility] Sit to Stand: Min assist Stand pivot transfers: Mod assist       General transfer comment: Min assist for steading with sit to stand. Mod assist to pivot due to balance deficits; no R  knee buckling noted but pt unsteady throghout transfer    Balance Overall balance assessment: Needs assistance Sitting-balance support: Feet supported;No upper extremity supported Sitting balance-Leahy Scale: Good     Standing balance support: Single extremity supported Standing balance-Leahy Scale: Poor                             ADL either performed or assessed with clinical judgement   ADL Overall ADL's : Needs assistance/impaired                 Upper Body Dressing : Maximal assistance Upper Body Dressing Details (indicate cue type and reason): for sling management     Toilet Transfer: Moderate assistance;Stand-pivot (HHA) Toilet Transfer Details (indicate cue type and reason): Simulated by bed >chair         Functional mobility during ADLs: Moderate assistance (HHA, few steps to chair) General ADL Comments: Pt reports hx of multiple falls in the past few years due to R knee buckling. Reviewed NWB on RUE and activity restrictions; pt verbalized understanding.     Vision       Perception     Praxis      Cognition Arousal/Alertness: Awake/alert Behavior During Therapy: WFL for tasks assessed/performed Overall Cognitive Status: Within Functional Limits for tasks assessed                                          Exercises Exercises: Shoulder Shoulder Exercises Shoulder Flexion:  AROM;Right;10 reps;Seated (0-90 degrees) Shoulder ABduction: AROM;Right;10 reps;Seated (0-60 degrees) Shoulder External Rotation: AAROM;Right;10 reps;Seated (able to acheive neutral) Elbow Flexion: AROM;Right;10 reps;Seated (full range) Wrist Flexion: AROM;Right;10 reps;Seated (full range) Digit Composite Flexion: AROM;Right;10 reps;Seated (full range)   Shoulder Instructions Shoulder Instructions Donning/doffing sling/immobilizer: Maximal assistance Correct positioning of sling/immobilizer: Maximal assistance ROM for elbow, wrist and digits of  operated UE: Minimal assistance (cues for initiation of exercises) Sling wearing schedule (on at all times/off for ADL's): Supervision/safety     General Comments      Pertinent Vitals/ Pain       Pain Assessment: Faces Faces Pain Scale: Hurts a little bit Pain Location: R shoulder Pain Descriptors / Indicators: Sore;Discomfort Pain Intervention(s): Monitored during session;Repositioned;Ice applied  Home Living                                          Prior Functioning/Environment              Frequency  Min 3X/week        Progress Toward Goals  OT Goals(current goals can now be found in the care plan section)  Progress towards OT goals: Progressing toward goals  Acute Rehab OT Goals Patient Stated Goal: return home OT Goal Formulation: With patient  Plan Discharge plan remains appropriate    Co-evaluation                 AM-PAC PT "6 Clicks" Daily Activity     Outcome Measure   Help from another person eating meals?: A Little Help from another person taking care of personal grooming?: A Lot Help from another person toileting, which includes using toliet, bedpan, or urinal?: A Lot Help from another person bathing (including washing, rinsing, drying)?: A Lot Help from another person to put on and taking off regular upper body clothing?: A Lot Help from another person to put on and taking off regular lower body clothing?: A Lot 6 Click Score: 13    End of Session Equipment Utilized During Treatment: Oxygen;Other (comment) (sling)  OT Visit Diagnosis: Unsteadiness on feet (R26.81);Other abnormalities of gait and mobility (R26.89);Muscle weakness (generalized) (M62.81);Pain Pain - Right/Left: Right Pain - part of body: Shoulder   Activity Tolerance Patient tolerated treatment well   Patient Left in chair;with call bell/phone within reach;with chair alarm set   Nurse Communication Mobility status;Weight bearing status         Time: 7915-0569 OT Time Calculation (min): 24 min  Charges: OT General Charges $OT Visit: 1 Procedure OT Treatments $Self Care/Home Management : 8-22 mins $Therapeutic Exercise: 8-22 mins  Kristopher Ayers A. Ulice Brilliant, M.S., OTR/L Pager: [place] 01/31/2017, 8:41 AM

## 2017-01-31 NOTE — Progress Notes (Signed)
Blood pressure 136/42 by manual cuff.  Poor PO intake but starting to drink water.  Nausea controled with antiemetics.  Urine output frequent but only 1-2 ounces each time per patient.  Not a significant change from pressures over the last 24 hours.  Will encourage PO intake and continue to monitor.

## 2017-01-31 NOTE — Progress Notes (Signed)
Subjective: 2 Days Post-Op Procedure(s) (LRB): REVERSE RIGHT SHOULDER ARTHROPLASTY (Right) Patient reports pain as mild.  OT at bedside reports that the patient normally walks with a cane in the right hand.  Since the R UE was operated, he is now in a sling and must use the cane in his LUE.  As a result he is having significant difficulty walking independently.  The therapies recommend SNF placement for rehab.  His wife has recently had hip surgery and is unable to assist meaningfully.  Objective: Vital signs in last 24 hours: Temp:  [97.4 F (36.3 C)-98.6 F (37 C)] 98.3 F (36.8 C) (06/03 0353) Pulse Rate:  [60-75] 75 (06/03 0353) BP: (109-132)/(35-42) 116/41 (06/03 0353) SpO2:  [95 %-98 %] 96 % (06/03 0353)  Intake/Output from previous day: 06/02 0701 - 06/03 0700 In: 535 [P.O.:535] Out: 150 [Urine:150] Intake/Output this shift: No intake/output data recorded.   Recent Labs  01/29/17 1744 01/30/17 0634  HGB 12.0* 11.7*    Recent Labs  01/29/17 1744 01/30/17 0634  WBC 9.9  --   RBC 3.83*  --   HCT 37.5* 37.0*  PLT 144*  --     Recent Labs  01/30/17 0634 01/31/17 0515  NA 138 136  K 4.8 4.2  CL 106 102  CO2 21* 24  BUN 36* 42*  CREATININE 1.59* 2.20*  GLUCOSE 161* 123*  CALCIUM 8.3* 8.0*   No results for input(s): LABPT, INR in the last 72 hours.  PE:  wn wd male in nad.  A and O x 4.  HOH.  R UE immobilized in a sling.  Dressing dry.  NVI.  Assessment/Plan: 2 Days Post-Op Procedure(s) (LRB): REVERSE RIGHT SHOULDER ARTHROPLASTY (Right) Discharge to SNF  PT, OT, SW consults.  HEWITTJenny Reichmann 01/31/2017, 8:25 AM

## 2017-02-01 ENCOUNTER — Inpatient Hospital Stay (HOSPITAL_COMMUNITY): Payer: Medicare HMO

## 2017-02-01 ENCOUNTER — Encounter (HOSPITAL_COMMUNITY): Payer: Self-pay | Admitting: Orthopedic Surgery

## 2017-02-01 DIAGNOSIS — N183 Chronic kidney disease, stage 3 unspecified: Secondary | ICD-10-CM

## 2017-02-01 DIAGNOSIS — R109 Unspecified abdominal pain: Secondary | ICD-10-CM

## 2017-02-01 DIAGNOSIS — R001 Bradycardia, unspecified: Secondary | ICD-10-CM

## 2017-02-01 DIAGNOSIS — N189 Chronic kidney disease, unspecified: Secondary | ICD-10-CM

## 2017-02-01 DIAGNOSIS — R1013 Epigastric pain: Secondary | ICD-10-CM

## 2017-02-01 DIAGNOSIS — N179 Acute kidney failure, unspecified: Secondary | ICD-10-CM

## 2017-02-01 DIAGNOSIS — R739 Hyperglycemia, unspecified: Secondary | ICD-10-CM

## 2017-02-01 DIAGNOSIS — I10 Essential (primary) hypertension: Secondary | ICD-10-CM

## 2017-02-01 MED ORDER — SUCRALFATE 1 GM/10ML PO SUSP
1.0000 g | Freq: Three times a day (TID) | ORAL | Status: DC
  Administered 2017-02-01 – 2017-02-06 (×17): 1 g via ORAL
  Filled 2017-02-01 (×22): qty 10

## 2017-02-01 MED ORDER — SODIUM CHLORIDE 0.9 % IV BOLUS (SEPSIS)
500.0000 mL | Freq: Once | INTRAVENOUS | Status: AC
  Administered 2017-02-01: 500 mL via INTRAVENOUS

## 2017-02-01 MED ORDER — SODIUM CHLORIDE 0.9 % IV SOLN
INTRAVENOUS | Status: AC
  Administered 2017-02-01: 22:00:00 via INTRAVENOUS

## 2017-02-01 MED ORDER — PANTOPRAZOLE SODIUM 40 MG PO TBEC
40.0000 mg | DELAYED_RELEASE_TABLET | Freq: Two times a day (BID) | ORAL | Status: DC
  Administered 2017-02-01 – 2017-02-06 (×9): 40 mg via ORAL
  Filled 2017-02-01 (×10): qty 1

## 2017-02-01 NOTE — NC FL2 (Signed)
[place] LEVEL OF CARE SCREENING TOOL     IDENTIFICATION  Patient Name: Kristopher Ayers Birthdate: [birth year] Sex: male Admission Date (Current Location): 01/29/2017  [facility] and [place] Number:  Herbalist and Address:  [facility], [place] [address] [place], [place] [address]      Provider Number: [id number]  Attending Physician Name and Address:  Netta Cedars, MD  Relative Name and Phone Number:       Current Level of Care: Hospital Recommended Level of Care: [place] Prior Approval Number:    Date Approved/Denied: 02/01/17 PASRR Number: [id number]  Discharge Plan: SNF    Current Diagnoses: Patient Active Problem List   Diagnosis Date Noted  . S/P shoulder replacement, right 01/29/2017  . Hypertension   . Hyperlipidemia   . CAD (coronary artery disease)     Orientation RESPIRATION BLADDER Height & Weight     Self, Time, Situation, Place  O2 (nasal cannula 2L) Continent Weight: 206 lb (93.4 kg) Height:  5\' 4"  (162.6 cm)  BEHAVIORAL SYMPTOMS/MOOD NEUROLOGICAL BOWEL NUTRITION STATUS      Continent Diet (See dc Summary)  AMBULATORY STATUS COMMUNICATION OF NEEDS Skin   Extensive Assist Verbally Surgical wounds (Closed Right Shoulder Incision, with ABD dressing)                       Personal Care Assistance Level of Assistance  Bathing, Feeding, Dressing Bathing Assistance: Maximum assistance Feeding assistance: Limited assistance Dressing Assistance: Maximum assistance     Functional Limitations Info  Sight, Hearing, Speech Sight Info: Adequate Hearing Info: Impaired Speech Info: Adequate    SPECIAL CARE FACTORS FREQUENCY  PT (By licensed PT), OT (By licensed OT)     PT Frequency: 5xweek OT Frequency: 5xweek            Contractures      Additional Factors Info  Allergies, Code Status, Psychotropic Code Status Info: Full Allergies Info: IRBESARTAN, NSAIDS, AMLODIPINE,  BUPRENORPHINE HCL, DIAZEPAM, HYDROCODONE-ACETAMINOPHEN, MORPHINE AND RELATED  Psychotropic Info: Cymbalta         Current Medications (02/01/2017):  This is the current hospital active medication list Current Facility-Administered Medications  Medication Dose Route Frequency Provider Last Rate Last Dose  . acetaminophen (TYLENOL) tablet 650 mg  650 mg Oral Q6H PRN Netta Cedars, MD   650 mg at 01/31/17 0248   Or  . acetaminophen (TYLENOL) suppository 650 mg  650 mg Rectal Q6H PRN Netta Cedars, MD      . allopurinol (ZYLOPRIM) tablet 100 mg  100 mg Oral Daily Netta Cedars, MD   100 mg at 02/01/17 0848  . aspirin chewable tablet 81 mg  81 mg Oral Daily Netta Cedars, MD   81 mg at 02/01/17 0848  . chlorthalidone (HYGROTON) tablet 12.5 mg  12.5 mg Oral Daily Netta Cedars, MD   12.5 mg at 02/01/17 0848  . docusate sodium (COLACE) capsule 100 mg  100 mg Oral BID Netta Cedars, MD   100 mg at 02/01/17 0847  . DULoxetine (CYMBALTA) DR capsule 60 mg  60 mg Oral Daily Netta Cedars, MD   60 mg at 02/01/17 0848  . fluticasone (FLONASE) 50 MCG/ACT nasal spray 1 spray  1 spray Each Nare Daily Netta Cedars, MD   1 spray at 02/01/17 1000  . fluticasone furoate-vilanterol (BREO ELLIPTA) 100-25 MCG/INH 1 puff  1 puff Inhalation Daily Netta Cedars, MD   1 puff at 01/31/17 2018  .  ipratropium (ATROVENT) nebulizer solution 0.5 mg  0.5 mg Nebulization Q6H PRN Netta Cedars, MD      . irbesartan Levy Sjogren) tablet 75 mg  75 mg Oral Daily Netta Cedars, MD   75 mg at 02/01/17 0847  . lactated ringers infusion   Intravenous Continuous Rica Koyanagi, MD 10 mL/hr at 01/29/17 0841    . menthol-cetylpyridinium (CEPACOL) lozenge 3 mg  1 lozenge Oral PRN Netta Cedars, MD       Or  . phenol (CHLORASEPTIC) mouth spray 1 spray  1 spray Mouth/Throat PRN Netta Cedars, MD      . metoCLOPramide (REGLAN) tablet 5-10 mg  5-10 mg Oral Q8H PRN Netta Cedars, MD       Or  . metoCLOPramide (REGLAN) injection 5-10 mg  5-10 mg  Intravenous Q8H PRN Netta Cedars, MD   10 mg at 01/30/17 0936  . metoprolol succinate (TOPROL-XL) 24 hr tablet 25 mg  25 mg Oral QPM Netta Cedars, MD   25 mg at 01/31/17 1756  . ondansetron (ZOFRAN) tablet 4 mg  4 mg Oral Q6H PRN Netta Cedars, MD   4 mg at 01/31/17 1940   Or  . ondansetron [facility]) injection 4 mg  4 mg Intravenous Q6H PRN Netta Cedars, MD   4 mg at 01/30/17 2111  . pantoprazole (PROTONIX) EC tablet 20 mg  20 mg Oral TID PRN Netta Cedars, MD   20 mg at 01/31/17 2258  . polyethylene glycol (MIRALAX / GLYCOLAX) packet 17 g  17 g Oral Daily PRN Netta Cedars, MD      . pravastatin (PRAVACHOL) tablet 40 mg  40 mg Oral q1800 Netta Cedars, MD   40 mg at 01/31/17 1756  . traMADol (ULTRAM) tablet 50 mg  50 mg Oral Q4H PRN Netta Cedars, MD         Discharge Medications: Please see discharge summary for a list of discharge medications.  Relevant Imaging Results:  Relevant Lab Results:   Additional Information [id number]  Normajean Baxter, LCSW

## 2017-02-01 NOTE — Progress Notes (Signed)
Occupational Therapy Treatment Patient Details Name: Kristopher Ayers MRN: [id number] DOB: [birth year] Today's Date: 02/01/2017    History of present illness Pt is an 83 y.o. male s/p R reverse TSA. PMHx: CAD, COPD, HTN, Ankle fusion, CABG, Lumbar laminectomy x3, Rotator cuff repair x2, L TKA.   OT comments  Provided education and reviewed shoulder exercises with pt and family. Reviewed WB and ROM restriction; family and pt verbalized understanding. Pt performed functional mobility to bathroom for toilet transfer with Max A and demonstrated decreased balance in standing. Discussed with pt and family dc option and recommendations. Continue to feel pt would benefit from further OT at SNF to increase pt safety and independence prior to transitioning home. Will continue to follow acutely to facilitate safe dc and optimize function.   Follow Up Recommendations  SNF;Supervision/Assistance - 24 hour    Equipment Recommendations  3 in 1 bedside commode    Recommendations for Other Services PT consult    Precautions / Restrictions Precautions Precautions: Fall;Shoulder Type of Shoulder Precautions: Active protocol: AROM elbow/wrist/hand OK. A/PROM shoulder: FF 90, ABD 60, ER 30. Shoulder Interventions: Shoulder sling/immobilizer;For comfort (and for sleep) Precaution Booklet Issued: Yes (comment) Precaution Comments: Reviewed precautions with pt Required Braces or Orthoses: Sling Restrictions Weight Bearing Restrictions: Yes RUE Weight Bearing: Non weight bearing       Mobility Bed Mobility               General bed mobility comments: In recliner upon arrival  Transfers Overall transfer level: Needs assistance Equipment used: 1 person hand held assist Transfers: Sit to/from Stand Sit to Stand: Mod assist         General transfer comment: Pt able to power up into standing but requires Mod A to stabilize in standing. Then requires Max A for functional mobility    Balance  Overall balance assessment: Needs assistance Sitting-balance support: Feet supported;No upper extremity supported Sitting balance-Leahy Scale: Good     Standing balance support: Single extremity supported Standing balance-Leahy Scale: Poor Standing balance comment: major LOB secondary to R knee buckling requiring maxA to steady.                            ADL either performed or assessed with clinical judgement   ADL Overall ADL's : Needs assistance/impaired                 Upper Body Dressing : Maximal assistance;Sitting Upper Body Dressing Details (indicate cue type and reason): for sling management     Toilet Transfer: Ambulation;BSC;Maximal assistance;Cueing for safety;Cueing for sequencing (HHA) Toilet Transfer Details (indicate cue type and reason): Pt performed fucntional mobility from recliner to [facility] (over toilet) in bathroom. Pt requires Max A for fucntional mobility and transfers due to his poor balance in standing, weakness in legs, and overall decreased activity tolernace.         Functional mobility during ADLs: Maximal assistance General ADL Comments: Pt reports hx of multiple falls in the past few years due to R knee buckling. Reviewed NWB on RUE and activity restrictions; pt verbalized understanding.     Vision       Perception     Praxis      Cognition Arousal/Alertness: Awake/alert Behavior During Therapy: WFL for tasks assessed/performed Overall Cognitive Status: Within Functional Limits for tasks assessed  Exercises Exercises: Shoulder Shoulder Exercises Shoulder Flexion: AROM;Right;Seated;5 reps (0-90 degrees) Shoulder ABduction: AROM;Right;Seated;5 reps (0-60 degrees) Shoulder External Rotation: AAROM;Right;Seated;5 reps (able to acheive neutral) Elbow Flexion: AROM;Right;10 reps;Seated (full range) Wrist Flexion:  (full range) Digit Composite Flexion:  (full range)    Shoulder Instructions Shoulder Instructions Donning/doffing shirt without moving shoulder: Maximal assistance (educated pt) Donning/doffing sling/immobilizer: Maximal assistance (educated pt) Correct positioning of sling/immobilizer: Maximal assistance (educated pt) ROM for elbow, wrist and digits of operated UE: Minimal assistance (cues for initiation of exercises) Sling wearing schedule (on at all times/off for ADL's): Supervision/safety (eduated pt)     General Comments      Pertinent Vitals/ Pain       Pain Assessment: Faces Faces Pain Scale: Hurts little more Pain Location: R shoulder and R knee Pain Descriptors / Indicators: Aching;Sore Pain Intervention(s): Monitored during session;Repositioned  Home Living                                          Prior Functioning/Environment              Frequency  Min 3X/week        Progress Toward Goals  OT Goals(current goals can now be found in the care plan section)  Progress towards OT goals: Progressing toward goals  Acute Rehab OT Goals Patient Stated Goal: return home OT Goal Formulation: With patient Time For Goal Achievement: 02/13/17 Potential to Achieve Goals: Good ADL Goals Pt Will Perform Upper Body Bathing: with min assist;with caregiver independent in assisting;sitting Pt Will Perform Upper Body Dressing: with min assist;with caregiver independent in assisting;sitting Pt Will Transfer to Toilet: with min guard assist;ambulating;bedside commode Additional ADL Goal #1: Pt/caregiver will independently don/doff sling as precursor to ADL and mobility. Additional ADL Goal #2: Pt/caregiver will demonstrate independence with RUE ROM exercises.  Plan Discharge plan remains appropriate    Co-evaluation                 AM-PAC PT "6 Clicks" Daily Activity     Outcome Measure   Help from another person eating meals?: A Little Help from another person taking care of personal grooming?:  A Lot Help from another person toileting, which includes using toliet, bedpan, or urinal?: A Lot Help from another person bathing (including washing, rinsing, drying)?: A Lot Help from another person to put on and taking off regular upper body clothing?: A Lot Help from another person to put on and taking off regular lower body clothing?: A Lot 6 Click Score: 13    End of Session Equipment Utilized During Treatment: Oxygen;Other (comment);Gait belt (sling)  OT Visit Diagnosis: Unsteadiness on feet (R26.81);Other abnormalities of gait and mobility (R26.89);Muscle weakness (generalized) (M62.81);Pain Pain - Right/Left: Right Pain - part of body: Shoulder   Activity Tolerance Patient tolerated treatment well   Patient Left in chair;with call bell/phone within reach;with chair alarm set   Nurse Communication Mobility status;Weight bearing status        Time: 6387-5643 OT Time Calculation (min): 37 min  Charges: OT General Charges $OT Visit: 1 Procedure OT Treatments $Self Care/Home Management : 8-22 mins $Therapeutic Activity: 8-22 mins  [place], OTR/L [place] 02/01/2017, 4:37 PM

## 2017-02-01 NOTE — Progress Notes (Signed)
Physical Therapy Treatment Patient Details Name: Kristopher Ayers MRN: [id number] DOB: Jul 09, 1926 Today's Date: 02/01/2017    History of Present Illness Pt is an 83 y.o. male s/p R reverse TSA. PMHx: CAD, COPD, HTN, Ankle fusion, CABG, Lumbar laminectomy x3, Rotator cuff repair x2, L TKA.    PT Comments    Although pt desires to go home at discharge he is making very slow progress towards his mobility goals. Pt's wife again said that the doctor said that the patient could use his cane in his R hand. PT double checked orders and educated pt and wife that he would have to use the cane in his L hand as he is NWB through R UE. PT worked to simulate home environment for mobility training with least possible assistance, despite that pt required minAx1 for bed mobility on a flat bed, minAx2 for sit>stand transfer from bed height, and modAx2 for maintaining balance with 12 feet of ambulation. Currently, pt is still not safe to discharge home and PT recommends SNF placement at discharge. PT will continue to work on cane training in L UE for balance with gait as well as functional mobility in simulated home environment to work towards maximal safety in his discharge environment.    Follow Up Recommendations  SNF;Supervision/Assistance - 24 hour     Equipment Recommendations  None recommended by PT    Recommendations for Other Services OT consult     Precautions / Restrictions Precautions Precautions: Fall;Shoulder Type of Shoulder Precautions: Active protocol: AROM elbow/wrist/hand OK. A/PROM shoulder: FF 90, ABD 60, ER 30. Shoulder Interventions: Shoulder sling/immobilizer;For comfort (and for sleep) Required Braces or Orthoses: Sling Restrictions Weight Bearing Restrictions: Yes RUE Weight Bearing: Non weight bearing    Mobility  Bed Mobility Overal bed mobility: Needs Assistance Bed Mobility: Supine to Sit     Supine to sit: Min assist     General bed mobility comments: bed  flattened to simulate home environment. minAx1 for bringing trunk to upright  Transfers Overall transfer level: Needs assistance Equipment used: Straight cane Transfers: Sit to/from Stand Sit to Stand: Min assist;+2 safety/equipment         General transfer comment: pt able to initiate powerup but requires minAx2 to come all the way to upright and to steady himself vc for utilizing cane in L UE to steady   Ambulation/Gait Ambulation/Gait assistance: Min assist;+2 physical assistance Ambulation Distance (Feet): 20 Feet (1x8, 1x12) Assistive device: Straight cane Gait Pattern/deviations: Step-through pattern;Decreased dorsiflexion - right;Decreased stance time - right;Antalgic;Trunk flexed Gait velocity: slowed    General Gait Details: modAx2 for steadying, PT tried to allow pt least amount of assist and pt able to ambulate about 6 feet before bilateral knees started to buckle, pt given vc for cane use to steady. Pt unable to regain balance. modAx2 for steadying and lowering to chair. Pt wife educated on pt's weakness and the impact on his and her safety when at home. Pt wife convinced they would be fine. PT asked pt's wife to walk with him. Pt able to walk 10 feet before loosing balance. Pt wife unable to steady. modAx2 from PT and tech required to safely get pt back to chair.        Balance Overall balance assessment: Needs assistance Sitting-balance support: Feet supported;No upper extremity supported Sitting balance-Leahy Scale: Good     Standing balance support: Single extremity supported Standing balance-Leahy Scale: Poor Standing balance comment: major LOB secondary to R knee buckling requiring maxA to steady.  Cognition Arousal/Alertness: Awake/alert Behavior During Therapy: WFL for tasks assessed/performed Overall Cognitive Status: Within Functional Limits for tasks assessed                                         Exercises Donning/doffing shirt without moving shoulder: Maximal assistance (educated pt) Donning/doffing sling/immobilizer: Maximal assistance (educated pt) Correct positioning of sling/immobilizer: Maximal assistance (educated pt) Sling wearing schedule (on at all times/off for ADL's): Supervision/safety (eduated pt)    General Comments        Pertinent Vitals/Pain Pain Assessment: Faces Faces Pain Scale: Hurts little more Pain Location: R shoulder and R knee Pain Descriptors / Indicators: Aching;Sore Pain Intervention(s): Monitored during session;Repositioned  SaO2 on RA >89% throughout session    [place] expects to be discharged to:: Private residence Living Arrangements: Spouse/significant other Available Help at Discharge: Family;Available 24 hours/day Type of Home: House Home Access: Stairs to enter   Home Layout: One level Home Equipment: Environmental consultant - 2 wheels;Walker - 4 wheels      Prior Function Level of Independence: Needs assistance  Gait / Transfers Assistance Needed: RW for all mobility ADL's / Homemaking Assistance Needed: Wife assisting with sponge bathing and dressing     PT Goals (current goals can now be found in the care plan section) Acute Rehab PT Goals Patient Stated Goal: return home PT Goal Formulation: With patient Time For Goal Achievement: 02/13/17 Potential to Achieve Goals: Good Progress towards PT goals: Progressing toward goals    Frequency    Min 3X/week      PT Plan Current plan remains appropriate    Co-evaluation              AM-PAC PT "6 Clicks" Daily Activity  Outcome Measure  Difficulty turning over in bed (including adjusting bedclothes, sheets and blankets)?: Total Difficulty moving from lying on back to sitting on the side of the bed? : Total Difficulty sitting down on and standing up from a chair with arms (e.g., wheelchair, bedside commode, etc,.)?: Total Help needed moving to and from a bed  to chair (including a wheelchair)?: A Little Help needed walking in hospital room?: A Lot Help needed climbing 3-5 steps with a railing? : Total 6 Click Score: 9    End of Session Equipment Utilized During Treatment: Gait belt Activity Tolerance: Patient limited by fatigue;Other (comment) (bilateral LE weakness) Patient left: in chair;with call bell/phone within reach;with family/visitor present Nurse Communication: Mobility status;Weight bearing status;Precautions PT Visit Diagnosis: Unsteadiness on feet (R26.81);Other abnormalities of gait and mobility (R26.89);Muscle weakness (generalized) (M62.81);Difficulty in walking, not elsewhere classified (R26.2);Pain Pain - Right/Left: Right Pain - part of body: Shoulder;Knee     Time: 4097-3532 PT Time Calculation (min) (ACUTE ONLY): 28 min  Charges:  $Gait Training: 23-37 mins                    G Codes:       Bartt Gonzaga B. Migdalia Dk PT, DPT Acute Rehabilitation  [phone] Pager [phone]     [place] 02/01/2017, 1:12 PM

## 2017-02-01 NOTE — Evaluation (Signed)
Clinical/Bedside Swallow Evaluation Patient Details  Name: Kristopher Ayers MRN: [id number] Date of Birth: 07/30/1926  Today's Date: 02/01/2017 Time: SLP Start Time (ACUTE ONLY): 57 SLP Stop Time (ACUTE ONLY): 1423 SLP Time Calculation (min) (ACUTE ONLY): 13 min  Past Medical History:  Past Medical History:  Diagnosis Date  . CAD (coronary artery disease)    CABG 2001; DES to Cx and OM1 09/18/15  . CKD (chronic kidney disease)   . COPD (chronic obstructive pulmonary disease) ([place])   . Diverticulosis    patient denies  . DJD (degenerative joint disease)   . Hyperlipidemia   . Hypertension   . Internal hemorrhoids    patient denies  . Kidney stone    Past Surgical History:  Past Surgical History:  Procedure Laterality Date  . ANKLE FUSION    . APPENDECTOMY    . CHOLECYSTECTOMY    . COLONOSCOPY    . CORONARY ARTERY BYPASS GRAFT  2001  . INGUINAL HERNIA REPAIR    . LEG SURGERY    . LUMBAR LAMINECTOMY     x3  . ROTATOR CUFF REPAIR     x 2  . TONSILLECTOMY AND ADENOIDECTOMY    . TOTAL KNEE ARTHROPLASTY     left   HPI:  Kristopher Ayers an 83 y.o.male history of CAD status post CABG, CAD, COPD, hyperlipidemia, hypertension, nephrolithiasis. Patient presented on 01/29/2017 for elective right shoulder reverse total arthroplasty. No surgical complications. Patient is currently postop day 3. Patient reports a long-standing intermittent history of dysphagia to both solids and liquids. This is been significantly worse than baseline ever since coming out of of the operating room. Patient is at several times where he describes a sensation of water or food getting stuck in requiring effort to get it down into his stomach. Patient also reports an increase of phlegm production. Patient was placed on Protonix 20 mg 3 times a day when necessary and is only received this twice and surgery. Patient does not typically take a PPI or H2 blocker. Patient is never seen gastroenterology for this  problem or had a speech evaluation. Patient also endorses epigastric abdominal pain complaint. Intermittent for years but much more persistent since surgery. Typically worse with food. Does not radiate anywhere. Patient endorses intermittent constipation chronically with 2 bowel movements on 01/30/2017 only since surgery. Very little oral intake since surgery.   Assessment / Plan / Recommendation Clinical Impression  Pt demonstrates adequate oral function, no signs of aspiration suggesting pharyngeal dysphagia. He is alert and participatory, but endorses pain in his mid chest with intake of solids. Suspect a primary esophageal dysphagia, possibly worsened from baseline due to use of narcotics and decreased mobility. Family reports occasional coughing during meals, throat clearing prior to admission. Offered basic esophageal precautions in verbal and written form. Pt would benefit from a f/u esophagram or consult from GI; wife is reluctant to address this acutely. Defer f/u to MD.  SLP Visit Diagnosis: Dysphagia, pharyngoesophageal phase (R13.14)    Aspiration Risk  Mild aspiration risk    Diet Recommendation Regular;Thin liquid   Liquid Administration via: Cup;Straw Medication Administration: Crushed with puree Supervision: Patient able to self feed Compensations: Slow rate;Small sips/bites Postural Changes: Seated upright at 90 degrees    Other  Recommendations Recommended Consults: Consider GI evaluation;Consider esophageal assessment Oral Care Recommendations: Oral care BID   Follow up Recommendations 24 hour supervision/assistance      Frequency and Duration  Prognosis        Swallow Study   General HPI: Kristopher Ayers an 83 y.o.male history of CAD status post CABG, CAD, COPD, hyperlipidemia, hypertension, nephrolithiasis. Patient presented on 01/29/2017 for elective right shoulder reverse total arthroplasty. No surgical complications. Patient is currently postop  day 3. Patient reports a long-standing intermittent history of dysphagia to both solids and liquids. This is been significantly worse than baseline ever since coming out of of the operating room. Patient is at several times where he describes a sensation of water or food getting stuck in requiring effort to get it down into his stomach. Patient also reports an increase of phlegm production. Patient was placed on Protonix 20 mg 3 times a day when necessary and is only received this twice and surgery. Patient does not typically take a PPI or H2 blocker. Patient is never seen gastroenterology for this problem or had a speech evaluation. Patient also endorses epigastric abdominal pain complaint. Intermittent for years but much more persistent since surgery. Typically worse with food. Does not radiate anywhere. Patient endorses intermittent constipation chronically with 2 bowel movements on 01/30/2017 only since surgery. Very little oral intake since surgery. Type of Study: Bedside Swallow Evaluation Previous Swallow Assessment: none Diet Prior to this Study: Regular;Thin liquids Temperature Spikes Noted: No Respiratory Status: Room air History of Recent Intubation: No Behavior/Cognition: Alert;Cooperative Oral Care Completed by SLP: Yes Oral Cavity - Dentition: Adequate natural dentition Vision: Functional for self-feeding Self-Feeding Abilities: Able to feed self Patient Positioning: Upright in chair Baseline Vocal Quality: Normal Volitional Cough: Strong Volitional Swallow: Able to elicit    Oral/Motor/Sensory Function Overall Oral Motor/Sensory Function: Within functional limits   Ice Chips     Thin Liquid Thin Liquid: Within functional limits Presentation: Cup;Straw;Self Fed    Nectar Thick Nectar Thick Liquid: Not tested   Honey Thick Honey Thick Liquid: Not tested   Puree Puree: Not tested   Solid   GO   Solid: Impaired Presentation: Self Fed        Kristopher Ayers, Katherene Ponto 02/01/2017,2:27 PM

## 2017-02-01 NOTE — Care Management Note (Signed)
Case Management Note  Patient Details  Name: Kristopher Ayers MRN: [id number] Date of Birth: Oct 03, 1925  Subjective/Objective:     RUE shoulder replacement, abd pain, CKD               Action/Plan: Discharge Planning: NCM spoke to wife and dtr at bedside. Family is requesting SNF rehab. CSW following for SNF. Pt does have a [place] and family will contact to see what benefits pt has for SNF or private duty caregiver in home.   PCP JOBE, Alexis Goodell MD  Expected Discharge Date:                Expected Discharge Plan:  [place]  In-House Referral:  Clinical Social Work  Discharge planning Services  CM Consult  Post Acute Care Choice:  NA Choice offered to:  NA  DME Arranged:  N/A DME Agency:  NA  HH Arranged:  NA HH Agency:  NA  Status of Service:  In process, will continue to follow  If discussed at Long Length of Stay Meetings, dates discussed:    Additional Comments:  Erenest Rasher, RN 02/01/2017, 5:08 PM

## 2017-02-01 NOTE — H&P (Signed)
Medical Consultation   Kristopher Ayers  BDZ:[id number]  DOB: Sep 08, 1925  DOA: 01/29/2017  PCP: Lilian Coma., MD   Outpatient Specialists:  Orthopedics, cardiology, ENT   Requesting physician: Dr. Veverly Fells - Ortho  Reason for consultation: Abdominal pain, elevating Cr and general medical management.    History of Present Illness: Kristopher Ayers is an 83 y.o. male [place] medical history of CAD status post CABG, CAD, COPD, hyperlipidemia, hypertension, nephrolithiasis. Patient presented on 01/29/2017 for elective right shoulder reverse total arthroplasty. No surgical complications. Patient is currently postop day 3. Consult to by primary team, orthopedics, for general medical management and ongoing symptoms as described below. Patient reports a long-standing intermittent history of dysphagia to both solids and liquids. This is been significantly worse than baseline ever since coming out of of the operating room. Patient is at several times where he describes a sensation of water or food getting stuck in requiring effort to get it down into his stomach. Patient also reports an increase of phlegm production. Patient was placed on Protonix 20 mg 3 times a day when necessary and is only received this twice and surgery. Patient does not typically take a  PPI or H2 blocker. Patient is never seen gastroenterology for this problem or had a speech evaluation. Patient also endorses epigastric abdominal pain complaint. Intermittent for years but much more persistent since surgery. Typically worse with food. Does not radiate anywhere. Patient endorses intermittent constipation chronically with 2 bowel movements on 01/30/2017 only since surgery. Very little oral intake since surgery. Denies cough, chest pain, shortness of breath, palpitations, abdominal distention, flank pain, neck stiffness, headache, LOC, focal neurological deficit, dizziness, melena, hematochezia, hematemesis.   Review  of Systems:  ROS As per HPI otherwise all other systems reviewed and are negative   Past Medical History: Past Medical History:  Diagnosis Date  . CAD (coronary artery disease)    CABG 2001; DES to Cx and OM1 09/18/15  . CKD (chronic kidney disease)   . COPD (chronic obstructive pulmonary disease) ([place])   . Diverticulosis    patient denies  . DJD (degenerative joint disease)   . Hyperlipidemia   . Hypertension   . Internal hemorrhoids    patient denies  . Kidney stone     Past Surgical History: Past Surgical History:  Procedure Laterality Date  . ANKLE FUSION    . APPENDECTOMY    . CHOLECYSTECTOMY    . COLONOSCOPY    . CORONARY ARTERY BYPASS GRAFT  2001  . INGUINAL HERNIA REPAIR    . LEG SURGERY    . LUMBAR LAMINECTOMY     x3  . ROTATOR CUFF REPAIR     x 2  . TONSILLECTOMY AND ADENOIDECTOMY    . TOTAL KNEE ARTHROPLASTY     left     Allergies:   Allergies  Allergen Reactions  . Irbesartan Cough    WEAKNESS  . Nsaids Rash and Other (See Comments)    RENAL INSUFFICIENCY  . Amlodipine Swelling    SWELLING REACTION UNSPECIFIED   . Buprenorphine Hcl     UNSPECIFIED REACTION   . Diazepam     UNSPECIFIED REACTION    . Hydrocodone-Acetaminophen Rash  . Morphine And Related Rash     Social History:  reports that he has quit smoking. He has never used smokeless tobacco. He reports that he does not drink alcohol or use drugs.  Family History: Family History  Problem Relation Age of Onset  . Congestive Heart Failure Father   . CAD Brother        CABG  . Colon cancer Neg Hx      Physical Exam: Vitals:   01/31/17 1140 01/31/17 1500 01/31/17 1949 01/31/17 2040  BP: (!) 114/38 (!) 121/42  (!) 109/38  Pulse: 72 70  71  Resp:  18  16  Temp:  99.2 F (37.3 C) 98.3 F (36.8 C) 98 F (36.7 C)  TempSrc:  Axillary Oral Oral  SpO2:  97%  92%  Weight:      Height:        General:  Appears calm and comfortable Eyes:  PERRL, EOMI, normal lids,  iris ENT:  grossly normal hearing, lips & tongue, mmm Neck:  no LAD, masses or thyromegaly Cardiovascular:  RRR, no m/r/g. No LE edema.  Respiratory:  CTA bilaterally, no w/r/r. Normal respiratory effort. Abdomen:  soft, ntnd, NABS Skin:  no rash or induration seen on limited exam Musculoskeletal: Right upper extremity immobilized in a sling. No focal bony abnormality. Symmetrical tone in lower extremities no global he somewhat weak. Psychiatric:  grossly normal mood and affect, speech fluent and appropriate, AOx3 Neurologic:  CN 2-12 grossly intact, moves all extremities in coordinated fashion, sensation intact  Data reviewed:  I have personally reviewed following labs and imaging studies Labs:  CBC:  Recent Labs Lab 01/29/17 1744 01/30/17 0634  WBC 9.9  --   HGB 12.0* 11.7*  HCT 37.5* 37.0*  MCV 97.9  --   PLT 144*  --     Basic Metabolic Panel:  Recent Labs Lab 01/29/17 1744 01/30/17 0634 01/31/17 0515  NA 139 138 136  K 5.0 4.8 4.2  CL 108 106 102  CO2 23 21* 24  GLUCOSE 130* 161* 123*  BUN 36* 36* 42*  CREATININE 1.45* 1.59* 2.20*  CALCIUM 8.6* 8.3* 8.0*  MG 1.7  --   --    GFR Estimated Creatinine Clearance: 23.9 mL/min (A) (by C-G formula based on SCr of 2.2 mg/dL (H)). Liver Function Tests: No results for input(s): AST, ALT, ALKPHOS, BILITOT, PROT, ALBUMIN in the last 168 hours. No results for input(s): LIPASE, AMYLASE in the last 168 hours. No results for input(s): AMMONIA in the last 168 hours. Coagulation profile No results for input(s): INR, PROTIME in the last 168 hours.  Cardiac Enzymes: No results for input(s): CKTOTAL, CKMB, CKMBINDEX, TROPONINI in the last 168 hours. BNP: Invalid input(s): POCBNP CBG: No results for input(s): GLUCAP in the last 168 hours. D-Dimer No results for input(s): DDIMER in the last 72 hours. Hgb A1c No results for input(s): HGBA1C in the last 72 hours. Lipid Profile No results for input(s): CHOL, HDL, LDLCALC,  TRIG, CHOLHDL, LDLDIRECT in the last 72 hours. Thyroid function studies  Recent Labs  01/29/17 1744  TSH 1.269   Anemia work up No results for input(s): VITAMINB12, FOLATE, FERRITIN, TIBC, IRON, RETICCTPCT in the last 72 hours. Urinalysis No results found for: COLORURINE, APPEARANCEUR, LABSPEC, [place], GLUCOSEU, HGBUR, BILIRUBINUR, KETONESUR, PROTEINUR, UROBILINOGEN, NITRITE, [place]   Microbiology No results found for this or any previous visit (from the past 240 hour(s)).     Inpatient Medications:   Scheduled Meds: . allopurinol  100 mg Oral Daily  . aspirin  81 mg Oral Daily  . chlorthalidone  12.5 mg Oral Daily  . docusate sodium  100 mg Oral BID  . DULoxetine  60 mg Oral  Daily  . fluticasone  1 spray Each Nare Daily  . fluticasone furoate-vilanterol  1 puff Inhalation Daily  . irbesartan  75 mg Oral Daily  . metoprolol succinate  25 mg Oral QPM  . pantoprazole  40 mg Oral BID AC  . pravastatin  40 mg Oral q1800  . sucralfate  1 g Oral TID WC & HS   Continuous Infusions: . sodium chloride    . lactated ringers 10 mL/hr at 01/29/17 0841  . sodium chloride       Radiological Exams on Admission: No results found.  Impression/Recommendations Active Problems:   Hypertension   Hyperlipidemia   CAD (coronary artery disease)   S/P shoulder replacement, right   Abdominal pain   AKI (acute kidney injury) ([place])   Epigastric abdominal pain   Hyperglycemia   Bradycardia   Essential hypertension   CKD (chronic kidney disease), stage III   RUE shoulder replacement: R shoulder reverse total shoulder arthroplasty. Surgery on 01/29/17 / POD 3 - Management per primary team  Abdominal pain: GERD vs ileus vs viral gastro. AFVSS, CBC and chemistry reassuring.  - KUB - Carafate,  - change PPI to scheduled and add H2 blocker - Continue reglan PRN  AoCKD: Cr. 2.2. Baseline per review of outside records ~1.5. Likely hypoperfusion and dehydration based.  - IVF  (500cc NS bolus and 75cc/hrx24hrs) - BMET in am - consider renal US and renal consult in am if not improving.    Dysphagia: likely secondary to general anesthesia and reflux esophagitis.  - SLP eval - Cepacol - carafate   Hyperglycemia: glucose climbing to 123. No h/o DM. Likely from acute stress from surgery.  - CBG TID AC - A1c  Bradycardia: Hr postop 35. Felt to be due in part to anesthesia and recording error. Asymptomatic throughout. Evaluated by cardiology who followed pt for 2 days. Signed off and recommending outpt f/u and Echo. HR remains in 70s while on bblocker - continue metop - f/u w/ cardiology as previously discussed.   HTN: - continue chlorthalidone, avapro, metoprolol  CAD: - continue statin, ASA  COPD: chronic and stable - continue nebulizers PRN - continue Breo Ellipta   Thank you for this consultation.  Our [facility] hospitalist team will follow the patient with you.   MERRELL, DAVID J M.D. [facility] 02/01/2017, 1:02 PM

## 2017-02-01 NOTE — Progress Notes (Signed)
Orthopedics Progress Note  Subjective: Patient complains of stomach discomfort/heartburn today also some pain in the buttock area from laying in bed. He has had several bowel movements   Objective:  Vitals:   01/31/17 1949 01/31/17 2040  BP:  (!) 109/38  Pulse:  71  Resp:  16  Temp: 98.3 F (36.8 C) 98 F (36.7 C)    General: Awake and alert  Musculoskeletal: right shoulder dressing  Changed and the wound looks good.  No pain with PROM of the shoulder. Neurovascularly intact  Lab Results  Component Value Date   WBC 9.9 01/29/2017   HGB 11.7 (L) 01/30/2017   HCT 37.0 (L) 01/30/2017   MCV 97.9 01/29/2017   PLT 144 (L) 01/29/2017       Component Value Date/Time   NA 136 01/31/2017 0515   K 4.2 01/31/2017 0515   CL 102 01/31/2017 0515   CO2 24 01/31/2017 0515   GLUCOSE 123 (H) 01/31/2017 0515   BUN 42 (H) 01/31/2017 0515   CREATININE 2.20 (H) 01/31/2017 0515   CALCIUM 8.0 (L) 01/31/2017 0515   GFRNONAA 25 (L) 01/31/2017 0515   GFRAA 29 (L) 01/31/2017 0515    No results found for: INR, PROTIME  Assessment/Plan: POD #3 s/p Procedure(s): REVERSE RIGHT SHOULDER ARTHROPLASTY Cardiology has signed off after following patient for bradycardia issue that was really the ventricular bigeminy with no BP issues. Multiple medical issues going on with the patient at this time including poor swallowing and what sounds like reflux as well as steadily increasing Creatinine numbers.  I will ask the [facility] service to come evaluate the patient and medically co-manage with me. FL-2 is signed but the patient is not medically stable at this time for discharge to either SNF OR to home with home health support. May need one to two more days to get the patient medically optimized  Thanks for medical and cardiology support  Doran Heater. Veverly Fells, MD 02/01/2017 11:16 AM

## 2017-02-01 NOTE — Op Note (Signed)
NAMEJATHAN, Kristopher Ayers NO.:  [id number]  MEDICAL RECORD NO.:  [id number]  LOCATION:                                 FACILITY:  PHYSICIAN:  Doran Heater. Veverly Fells, M.D.      DATE OF BIRTH:  DATE OF PROCEDURE:  01/29/2017 DATE OF DISCHARGE:                              OPERATIVE REPORT   PREOPERATIVE DIAGNOSIS:  Right shoulder rotator cuff tear arthropathy.  POSTOPERATIVE DIAGNOSIS:  Right shoulder rotator cuff tear arthropathy.  PROCEDURE PERFORMED:  Right shoulder reverse total shoulder arthroplasty using DePuy Delta Xtend prosthesis.  ATTENDING SURGEON:  Doran Heater. Veverly Fells, MD.  ASSISTANT:  Ventura Bruns, [facility] who has scrubbed the entire procedure and necessary for satisfactory completion of surgery.  ANESTHESIA:  General anesthesia was used plus interscalene block.  ESTIMATED BLOOD LOSS:  300 mL.  FLUID REPLACEMENT:  1500 mL crystalloid.  INSTRUMENT COUNTS:  Correct.  COMPLICATIONS:  None.  ANTIBIOTICS:  Perioperative antibiotics were given.  INDICATIONS:  The patient is an 83 year old male with worsening shoulder pain on the right secondary to severe end-stage arthritis with rotator cuff insufficiency.  The patient has very poor function, high levels of pain, and poor quality of life.  He has failed an extensive period of conservative management over the course of several years.  He presents now with debilitating shoulder pain, unable to sleep and able to get rest and having poor response to recent injections desiring reverse total shoulder arthroplasty.  Risks and benefits of surgery discussed, informed consent obtained.  DESCRIPTION OF PROCEDURE:  After an adequate level of anesthesia achieved, the patient was positioned in the modified beach-chair position.  Right shoulder correctly identified, sterilely prepped and draped in usual manner.  Time-out called.  We initiated shoulder surgery using a standard deltopectoral incision, started at coracoid  process, extending down to the anterior humerus.  Dissection down through subcutaneous tissues using Bovie.  We identified the cephalic vein, took it laterally with the deltoid, pectoralis taken medially.  Conjoint tendon identified, retracted medially.  We tenodesed the biceps in situ with figure-of-eight suture into the pec tendon and through the biceps sheath.  We next released the subscap subperiosteally off the lesser tuberosity and tagged for retraction.  We then released the inferior capsule progressively externally rotating.  We did release the biceps at the joint line and the remnant of the supraspinatus and infraspinatus tendons, which were basically did not appear normal and appeared fairly scarred.  Once we had that released, we were able to extend the shoulder and deliver the humeral head out of the wound.  We entered the proximal humerus with a 6 mm reamer and then reamed up to a size 14.  We then placed our intramedullary resection guide for the head resection, resected in 10 degrees of retroversion with an oscillating saw to the appropriate height.  Once we had done that, we removed excess osteophytes with a large rongeur, getting back to native bone.  We then went ahead and placed our metaphyseal reaming guide for the epi-2 right shoulder for the DePuy Delta Xtend prosthesis.  We reamed for the epi-2 and then we placed the trial component, 14 body epi-2  right metaphysis set on the 0 setting and placed in 10 degrees of retroversion, impacting that position.  We removed the trial components, subluxed the humerus posteriorly, and did a 360-degree capsulectomy and glenoid labrum removal.  Advanced calcification was noted in the glenoid labrum as well large loose body.  There was quite a bit of fluid from various pockets of additional fluid around the posterior aspect of the rotator cuff and also in the subscap recess.  Those were evacuated with the suction and I was able to  use my finger to make sure we did a full sweep and then noted the loose bodies.  Once we had full exposure of the glenoid, we placed a central guide pin.  We reamed for the metaglene, we drilled the central peg hole and impacted the metaglene or base plate into position with the bottom screw directly adjacent to the inferior scapular neck. We then drilled a 48 screw inferiorly, a 24 lock superiorly.  The patient had a pretty tall glenoid and thus our top screw was not able to get in the base of the coracoid, but had good purchase with a 24.  We had 18 anterior and posterior with good purchase.  We seated it nice and low, so we could get good clearance on the inferior scapular neck.  We then placed our 42 standard glenosphere into position, carefully protected the axillary nerve.  We screwed that into place and then impacted and screwed again, make sure it was stable.  Once that was stable, I checked, did a final check on the axillary nerve, it was free and clear.  We then went ahead and impacted our HA coated real 14 stem and epi-2 right metaphysis on the 0 setting and placed in 10 degrees retroversion, impacted that in position.  We did use available bone graft from the head with impaction grafting technique, very nice tight fit.  Then, we selected a 42 +3 poly, placed that in position, impacted that and then reduced the shoulder with a nice little pop.  It was good and tight, not excessively so.  Axillary nerve was tight, but not too tight.  We ranged the shoulder, no significant gapping noted with either inferior pole or external rotation.  We irrigated thoroughly, resected the remnant of the subscap.  Final inspection of the shoulder, removed a little bit more bursa and then closed the deltopectoral interval with 0 Vicryl suture, followed by 2-0 Vicryl for subcutaneous closure, and 4-0 Monocryl for skin.  Steri-Strips applied, followed by sterile dressing. The patient tolerated the  surgery well.     Doran Heater. Veverly Fells, M.D.   ______________________________ Doran Heater. Veverly Fells, M.D.    SRN/MEDQ  D:  01/29/2017  T:  01/29/2017  Job:  [id number]

## 2017-02-02 DIAGNOSIS — M19011 Primary osteoarthritis, right shoulder: Secondary | ICD-10-CM

## 2017-02-02 LAB — CBC WITH DIFFERENTIAL/PLATELET
BASOS PCT: 0 %
Basophils Absolute: 0 10*3/uL (ref 0.0–0.1)
Eosinophils Absolute: 0.3 10*3/uL (ref 0.0–0.7)
Eosinophils Relative: 4 %
HEMATOCRIT: 30.3 % — AB (ref 39.0–52.0)
HEMOGLOBIN: 9.6 g/dL — AB (ref 13.0–17.0)
LYMPHS ABS: 1.2 10*3/uL (ref 0.7–4.0)
Lymphocytes Relative: 20 %
MCH: 30.9 pg (ref 26.0–34.0)
MCHC: 31.7 g/dL (ref 30.0–36.0)
MCV: 97.4 fL (ref 78.0–100.0)
MONOS PCT: 10 %
Monocytes Absolute: 0.6 10*3/uL (ref 0.1–1.0)
NEUTROS ABS: 4 10*3/uL (ref 1.7–7.7)
NEUTROS PCT: 66 %
Platelets: 141 10*3/uL — ABNORMAL LOW (ref 150–400)
RBC: 3.11 MIL/uL — AB (ref 4.22–5.81)
RDW: 13.7 % (ref 11.5–15.5)
WBC: 6.1 10*3/uL (ref 4.0–10.5)

## 2017-02-02 LAB — BASIC METABOLIC PANEL
ANION GAP: 8 (ref 5–15)
BUN: 33 mg/dL — ABNORMAL HIGH (ref 6–20)
CHLORIDE: 105 mmol/L (ref 101–111)
CO2: 24 mmol/L (ref 22–32)
Calcium: 8.4 mg/dL — ABNORMAL LOW (ref 8.9–10.3)
Creatinine, Ser: 1.31 mg/dL — ABNORMAL HIGH (ref 0.61–1.24)
GFR calc non Af Amer: 47 mL/min — ABNORMAL LOW (ref >60)
GFR, EST AFRICAN AMERICAN: 54 mL/min — AB (ref >60)
Glucose, Bld: 126 mg/dL — ABNORMAL HIGH (ref 65–99)
Potassium: 4.5 mmol/L (ref 3.5–5.1)
Sodium: 137 mmol/L (ref 135–145)

## 2017-02-02 LAB — GLUCOSE, CAPILLARY
GLUCOSE-CAPILLARY: 132 mg/dL — AB (ref 65–99)
Glucose-Capillary: 104 mg/dL — ABNORMAL HIGH (ref 65–99)
Glucose-Capillary: 114 mg/dL — ABNORMAL HIGH (ref 65–99)

## 2017-02-02 NOTE — Progress Notes (Signed)
PROGRESS NOTE/consult    Kristopher Ayers  QJJ:[id number] DOB: 07-19-1926 DOA: 01/29/2017 PCP: Lilian Coma., MD    Brief Narrative:  Patient is a 83 year old gentleman with history of coronary artery disease status post CABG, COPD, lipidemia, hypertension, nephrolithiasis presented on 01/29/2017 for elective right shoulder reverse total arthroplasty with no surgical complications. On postop day 3 [facility] were consulted for general medical management as well as ongoing symptoms of long-standing intermittent history of dysphagia to both solids and liquids, epigastric abdominal pain, worsening renal function.   Assessment & Plan:   Principal Problem:   S/P shoulder replacement, right Active Problems:   AKI (acute kidney injury) ([place])   Hypertension   Hyperlipidemia   CAD (coronary artery disease)   Abdominal pain   Epigastric abdominal pain   Hyperglycemia   Bradycardia   Essential hypertension   CKD (chronic kidney disease), stage III  #1 status post right upper extremity shoulder replacement with right shoulder reverse total shoulder arthroplasty 01/29/2017 Postop day 4. Per primary team.  #2 abdominal pain Likely gastroesophageal reflux disease/gastritis. Patient with clinical improvement. KUB negative for any acute abnormalities. Continue PPI, H2 blocker, Carafate.  #3 acute on chronic kidney disease stage III Per records baseline creatinine of personally 1.5. Likely secondary to a prerenal azotemia. Patient hydrated with IV fluids with improvement in renal function. Creatinine currently at 1.31 from 2.20. Continue gentle hydration. Follow.  #4 hypertension Blood pressure stable. Continue chlorthalidone, Avapro, metoprolol.  #5 dysphagia Likely secondary to general anesthesia and reflux esophagitis. Improving. SLP evaluation pending. Continue Carafate and supper call lozenges.  #6 bradycardia Patient noted to have a postop heart rate of 35 for likely secondary  to anesthesia versus recording error. Patient remained asymptomatic. Patient seen in consultation by cardiology. Heart rate improved and remained in the 70s while on beta blocker. Cardiology following.  #7 coronary artery disease Stable. Continue statin and aspirin and beta blocker.  #8 coronary artery disease Stable. Continue aspirin, statin.  #9 COPD Stable. Continue nebulizers and breo ellipta     DVT prophylaxis: Per primary team. Code Status: Full Family Communication: Updated patient and family at bedside. Disposition Plan: Per primary team.   Consultants:   [facility] hospitalists: Dr. Marily Memos 02/01/2017  Procedures:   Abdominal x-ray 02/01/2017  Right shoulder reverse total shoulder arthroplasty per Dr. Veverly Fells 01/29/2017  Antimicrobials:   None   Subjective: Patient states swallowing difficulty has improved. Epigastric pain improving. No shortness of breath. No chest pain.  Objective: Vitals:   02/01/17 1800 02/01/17 2039 02/02/17 0600 02/02/17 0856  BP:  (!) 142/53 (!) 137/59   Pulse: 64 84 75   Resp: 16 16 16    Temp:  98.1 F (36.7 C) 98.6 F (37 C)   TempSrc:  Oral Oral   SpO2:  97% 97% 98%  Weight:      Height:        Intake/Output Summary (Last 24 hours) at 02/02/17 1303 Last data filed at 02/02/17 0600  Gross per 24 hour  Intake              225 ml  Output              350 ml  Net             -125 ml   Filed Weights   01/29/17 0832  Weight: 93.4 kg (206 lb)    Examination:  General exam: Appears calm and comfortable  Respiratory system: Clear to auscultation. Respiratory  effort normal. Cardiovascular system: S1 & S2 heard, RRR. No JVD, murmurs, rubs, gallops or clicks. No pedal edema. Gastrointestinal system: Abdomen is nondistended, soft and nontender. No organomegaly or masses felt. Normal bowel sounds heard. Central nervous system: Alert and oriented. No focal neurological deficits. Extremities: Right upper extremity in sling.  Symmetric 5 x 5 power. Skin: No rashes, lesions or ulcers Psychiatry: Judgement and insight appear normal. Mood & affect appropriate.     Data Reviewed: I have personally reviewed following labs and imaging studies  CBC:  Recent Labs Lab 01/29/17 1744 01/30/17 0634 02/02/17 0818  WBC 9.9  --  6.1  NEUTROABS  --   --  4.0  HGB 12.0* 11.7* 9.6*  HCT 37.5* 37.0* 30.3*  MCV 97.9  --  97.4  PLT 144*  --  846*   Basic Metabolic Panel:  Recent Labs Lab 01/29/17 1744 01/30/17 0634 01/31/17 0515 02/02/17 0818  NA 139 138 136 137  K 5.0 4.8 4.2 4.5  CL 108 106 102 105  CO2 23 21* 24 24  GLUCOSE 130* 161* 123* 126*  BUN 36* 36* 42* 33*  CREATININE 1.45* 1.59* 2.20* 1.31*  CALCIUM 8.6* 8.3* 8.0* 8.4*  MG 1.7  --   --   --    GFR: Estimated Creatinine Clearance: 40.2 mL/min (A) (by C-G formula based on SCr of 1.31 mg/dL (H)). Liver Function Tests: No results for input(s): AST, ALT, ALKPHOS, BILITOT, PROT, ALBUMIN in the last 168 hours. No results for input(s): LIPASE, AMYLASE in the last 168 hours. No results for input(s): AMMONIA in the last 168 hours. Coagulation Profile: No results for input(s): INR, PROTIME in the last 168 hours. Cardiac Enzymes: No results for input(s): CKTOTAL, CKMB, CKMBINDEX, TROPONINI in the last 168 hours. BNP (last 3 results) No results for input(s): PROBNP in the last 8760 hours. HbA1C: No results for input(s): HGBA1C in the last 72 hours. CBG:  Recent Labs Lab 02/02/17 0759 02/02/17 1217  GLUCAP 104* 132*   Lipid Profile: No results for input(s): CHOL, HDL, LDLCALC, TRIG, CHOLHDL, LDLDIRECT in the last 72 hours. Thyroid Function Tests: No results for input(s): TSH, T4TOTAL, FREET4, T3FREE, THYROIDAB in the last 72 hours. Anemia Panel: No results for input(s): VITAMINB12, FOLATE, FERRITIN, TIBC, IRON, RETICCTPCT in the last 72 hours. Sepsis Labs: No results for input(s): PROCALCITON, LATICACIDVEN in the last 168 hours.  No results  found for this or any previous visit (from the past 240 hour(s)).       Radiology Studies: Dg Abd Portable 1v  Result Date: 02/01/2017 CLINICAL DATA:  Abdominal pain and distention, difficulty swallowing foods occurring the family, history hypertension, hyperlipidemia, kidney stones, COPD, chronic kidney disease, coronary artery disease, colonic diverticulosis EXAM: PORTABLE ABDOMEN - 1 VIEW COMPARISON:  CT abdomen and pelvis 10/08/2009 FINDINGS: Scattered gas throughout air-filled nondistended colon. Small bowel gas pattern normal. No bowel dilatation or bowel wall thickening. Bones appear slightly demineralized with evidence of prior lumbar fusion. No definite urinary tract calcification. IMPRESSION: Nonspecific bowel gas pattern. Electronically Signed   By: Lavonia Dana M.D.   On: 02/01/2017 17:02        Scheduled Meds: . allopurinol  100 mg Oral Daily  . aspirin  81 mg Oral Daily  . chlorthalidone  12.5 mg Oral Daily  . docusate sodium  100 mg Oral BID  . DULoxetine  60 mg Oral Daily  . fluticasone  1 spray Each Nare Daily  . fluticasone furoate-vilanterol  1 puff Inhalation Daily  .  irbesartan  75 mg Oral Daily  . metoprolol succinate  25 mg Oral QPM  . pantoprazole  40 mg Oral BID AC  . pravastatin  40 mg Oral q1800  . sucralfate  1 g Oral TID WC & HS   Continuous Infusions: . lactated ringers 10 mL/hr at 01/29/17 0841     LOS: 4 days    Time spent: 42 minutes    Grantham Hippert, MD [facility] Pager [phone]  If 7PM-7AM, please contact night-coverage [email or web address] Password [email or web address] 02/02/2017, 1:03 PM

## 2017-02-02 NOTE — Clinical Social Work Note (Signed)
Clinical Social Work Assessment  Patient Details  Name: Kristopher Ayers MRN: [id number] Date of Birth: 1925/10/21  Date of referral:  02/01/17               Reason for consult:  Facility Placement                Permission sought to share information with:  Facility Art therapist granted to share information::  Yes, Verbal Permission Granted  Name::     son n law, wife  Agency::  SNF  Relationship::     Contact Information:     Housing/Transportation Living arrangements for the past 2 months:  Single Family Home Source of Information:  Spouse, Adult Children Patient Interpreter Needed:  None Criminal Activity/Legal Involvement Pertinent to Current Situation/Hospitalization:  No - Comment as needed Significant Relationships:  Adult Children, Spouse Lives with:  Spouse Do you feel safe going back to the place where you live?  No Need for family participation in patient care:  Yes (Comment)  Care giving concerns:  Patient resides with spouse. Spouse unable to care for patient due to impairment. Patient unsafe to return home at this time.  Social Worker assessment / plan:  CSW met with patient, spouse and son-n-law to discuss clinical team recommendations for DC. Patient and spouse want to return home, however is open to the possible need for SNF. CSW explained her role in the placement process and discussed SNF options/placement. CSW discussed insurance auth and that process. Patient/family gave permission to send out offers to [place] area for SNF placement. Family indicated that [place] area is too far and declined to send to local SNF's.  FL2 and passr obtained. Offers sent.  Employment status:  Retired Nurse, adult PT Recommendations:  [place] / Referral to community resources:  [place]  Patient/Family's Response to care:  Psychologist, prison and probation services of CSW assistance with placement  options at DC. No issues identified by patient/family.  Patient/Family's Understanding of and Emotional Response to Diagnosis, Current Treatment, and Prognosis:  Patient/family has good understanding of diagnosis, current treatment and prognosis. Patient/family are ambivalent about snf vs home and are exploring options. They are hopeful that patient will improve with time and understand that additional care is needed. No issues or concerns identified at this time.  Emotional Assessment Appearance:  Appears stated age Attitude/Demeanor/Rapport:   (Cooperative) Affect (typically observed):  Accepting, Appropriate Orientation:  Oriented to Self, Oriented to Place, Oriented to  Time, Oriented to Situation Alcohol / Substance use:  Not Applicable Psych involvement (Current and /or in the community):  No (Comment)  Discharge Needs  Concerns to be addressed:  Care Coordination Readmission within the last 30 days:  No Current discharge risk:  Dependent with Mobility, Physical Impairment Barriers to Discharge:  No Barriers Identified   Normajean Baxter, LCSW 02/02/2017, 9:46 AM

## 2017-02-02 NOTE — Progress Notes (Signed)
Orthopedics Progress Note  Subjective: Patient feeling much better.  Improved po intake  Objective:  Vitals:   02/01/17 2039 02/02/17 0600  BP: (!) 142/53 (!) 137/59  Pulse: 84 75  Resp: 16 16  Temp: 98.1 F (36.7 C) 98.6 F (37 C)    General: Awake and alert  Musculoskeletal: Increased generalized swelling in the R UE, NVI, decreased grip due to swelling, pulses intact Neurovascularly intact  Lab Results  Component Value Date   WBC 6.1 02/02/2017   HGB 9.6 (L) 02/02/2017   HCT 30.3 (L) 02/02/2017   MCV 97.4 02/02/2017   PLT 141 (L) 02/02/2017       Component Value Date/Time   NA 137 02/02/2017 0818   K 4.5 02/02/2017 0818   CL 105 02/02/2017 0818   CO2 24 02/02/2017 0818   GLUCOSE 126 (H) 02/02/2017 0818   BUN 33 (H) 02/02/2017 0818   CREATININE 1.31 (H) 02/02/2017 0818   CALCIUM 8.4 (L) 02/02/2017 0818   GFRNONAA 47 (L) 02/02/2017 0818   GFRAA 54 (L) 02/02/2017 0818    No results found for: INR, PROTIME  Assessment/Plan: POD #4 s/p Procedure(s): REVERSE RIGHT SHOULDER ARTHROPLASTY I have discussed with the patient and his wife that both OT and PT are recommending short term SNF for intensive therapy in an effort to improve balance and mobility and use of the right UE s/p surgery. Despite changing the WB status of the R UE to WBAT for use with balance and the walker the patient remains at high fall risk and needs a lot of support.  The sling may be removed as tolerated and only used if needed by the patient, The patient and wife ARE amenable to short term SNF ([facility] and [place] SNFs are their preference) Due to the swelling in the right arm which is worse than yesterday, will check a Right UE doppler U/S tomorrow morning to rule out DVT PRIOR to D/C to SNF, thanks! Cr improved today, Hgb stable The patient looks better to me today in terms of color and energy.  Doran Heater. Veverly Fells, MD 02/02/2017 6:04 PM

## 2017-02-02 NOTE — Care Management Note (Signed)
Case Management Note  Patient Details  Name: Kristopher Ayers MRN: [id number] Date of Birth: 08-Feb-1926  Subjective/Objective:                 Spoke with patient, wife and son in law at the bedside. Wife feels like she can take care of patient at home. She recently had hip injury and familyu is deeply concerned of her ability safely manage him. PT/ OT/ Ortho MD in concordance that short term SNF prior to returning home would be more clinically appropriate. CSW following, wife declined offer that was made, and currently insurance auth is pending for another facility. They were provided [facility] and private duty list. Also requested more in house PT, CM passed request to get patient out of bed to chair to staff. CM suggested to family to try to arrive in agreement about dispo plan as patient may be ready to DC in next 24 hours. Wife stated "I though it was up to the caregiver to decide." CM verified with her it was up to the patient and we encourage family to be involved as much as possible as going home would require considerable support. Reviewed difference in care available between [facility] and SNF and that assistance may not even be available everyday and if so it may only be for 45 minute sessions. Any more help in the home would have to come from family, friends, or private hired help. Strongly encouraged her to consider input from the medical professionals and family around her as decisions are made.   IVF running, [facility] consulted for epigastric pain and kidney function, following Cr, trending down but still elevated.    Action/Plan:  CM will continue to follow along with CSW. Expected Discharge Date:                  Expected Discharge Plan:  Skilled Nursing Facility  In-House Referral:  Clinical Social Work  Discharge planning Services  CM Consult  Post Acute Care Choice:  NA Choice offered to:  NA  DME Arranged:  N/A DME Agency:  NA  HH Arranged:  NA HH Agency:  NA  Status of Service:  In  process, will continue to follow  If discussed at Long Length of Stay Meetings, dates discussed:    Additional Comments:  Carles Collet, RN 02/02/2017, 12:44 PM

## 2017-02-02 NOTE — Care Management Important Message (Signed)
Important Message  Patient Details  Name: Kristopher Ayers MRN: [id number] Date of Birth: 1926-03-01   Medicare Important Message Given:  Yes    [name] 02/02/2017, 1:34 PM

## 2017-02-02 NOTE — Progress Notes (Signed)
Physical Therapy Treatment Patient Details Name: Kristopher Ayers MRN: [id number] DOB: [birth year] Today's Date: 02/02/2017    History of Present Illness Pt is an 83 y.o. male s/p R reverse TSA. PMHx: CAD, COPD, HTN, Ankle fusion, CABG, Lumbar laminectomy x3, Rotator cuff repair x2, L TKA.    PT Comments    Pt has less pain and more endurance today and is able to make good progress towards his goals, however he continues to have LE weakness that contributes to knee buckling that pt is unable to correct for without maxA from therapy even with utilizing both UE with RW. Pt is currently min guard for bed mobility and transfer to RW and maxA for ambulation due to LoB. PT continues to recommend SNF placement. Pt requires skilled PT to work on gait and balance training and LE strengthening in addition to rehab for R shoulder replacement to be safely mobilize in his discharge environment.      Follow Up Recommendations  SNF;Supervision/Assistance - 24 hour     Equipment Recommendations  None recommended by PT    Recommendations for Other Services OT consult     Precautions / Restrictions Precautions Precautions: Fall;Shoulder Type of Shoulder Precautions: Active protocol: AROM elbow/wrist/hand OK. A/PROM shoulder: FF 90, ABD 60, ER 30. Shoulder Interventions: Shoulder sling/immobilizer;For comfort (and for sleep) Required Braces or Orthoses: Sling Restrictions Weight Bearing Restrictions: Yes RUE Weight Bearing: Weight bearing as tolerated    Mobility  Bed Mobility Overal bed mobility: Needs Assistance Bed Mobility: Supine to Sit;Sit to Supine     Supine to sit: Min guard Sit to supine: Min guard   General bed mobility comments: bed flattened to simulate home environment. min guard given pt struggled and required increased time  vc to not push through R UE  Transfers Overall transfer level: Needs assistance Equipment used: Rolling walker (2 wheeled) Transfers: Sit to/from  Stand Sit to Stand: Min guard         General transfer comment: 5x attempt sit>stand before achieving upright vc for not pushing up with R UE, pt unable to control discent into recliner after walking    Ambulation/Gait Ambulation/Gait assistance: Max assist Ambulation Distance (Feet): 200 Feet Assistive device: Rolling walker (2 wheeled) Gait Pattern/deviations: Step-through pattern;Decreased dorsiflexion - right;Decreased stance time - right;Antalgic;Trunk flexed;Shuffle Gait velocity: slowed  Gait velocity interpretation: Below normal speed for age/gender General Gait Details: pt able to ambulate much greater distance today, but continues to have R knee buckling and is able to better compensate with use of the RW however pt experienced 1x LoB requiring maxA of PT to steady        Balance Overall balance assessment: Needs assistance Sitting-balance support: Feet supported;No upper extremity supported Sitting balance-Leahy Scale: Good     Standing balance support: Single extremity supported Standing balance-Leahy Scale: Poor Standing balance comment: major LOB secondary to R knee buckling requiring maxA to steady.                             Cognition Arousal/Alertness: Awake/alert Behavior During Therapy: WFL for tasks assessed/performed Overall Cognitive Status: Within Functional Limits for tasks assessed                                           General Comments General comments (skin integrity, edema, etc.): Pt wife and son-in  law present during session. PT educated family on benefits of getting stronger at SNF to be safer at home. Pt wife convinced that he will be fine at home he just needs more therapy. Family educated that pt will receive more therapy in SNF as opposed to [facility] which will increase pt safety when he does go home.       Pertinent Vitals/Pain Pain Assessment: Faces Faces Pain Scale: Hurts little more Pain Location: R  shoulder and R knee Pain Descriptors / Indicators: Aching;Sore Pain Intervention(s): Monitored during session  VSS    Home Living Family/patient expects to be discharged to:: Private residence Living Arrangements: Spouse/significant other Available Help at Discharge: Family;Available 24 hours/day Type of Home: House Home Access: Stairs to enter   Home Layout: One level Home Equipment: Environmental consultant - 2 wheels;Walker - 4 wheels      Prior Function Level of Independence: Needs assistance  Gait / Transfers Assistance Needed: RW for all mobility ADL's / Homemaking Assistance Needed: Wife assisting with sponge bathing and dressing     PT Goals (current goals can now be found in the care plan section) Acute Rehab PT Goals Patient Stated Goal: return home PT Goal Formulation: With patient Time For Goal Achievement: 02/13/17 Potential to Achieve Goals: Good    Frequency    Min 3X/week      PT Plan Current plan remains appropriate    Co-evaluation              AM-PAC PT "6 Clicks" Daily Activity  Outcome Measure  Difficulty turning over in bed (including adjusting bedclothes, sheets and blankets)?: A Lot Difficulty moving from lying on back to sitting on the side of the bed? : A Lot Difficulty sitting down on and standing up from a chair with arms (e.g., wheelchair, bedside commode, etc,.)?: A Lot Help needed moving to and from a bed to chair (including a wheelchair)?: A Little Help needed walking in hospital room?: A Lot Help needed climbing 3-5 steps with a railing? : Total 6 Click Score: 12    End of Session Equipment Utilized During Treatment: Gait belt Activity Tolerance: Patient limited by fatigue;Other (comment) (bilateral LE weakness) Patient left: in chair;with call bell/phone within reach;with family/visitor present Nurse Communication: Mobility status;Weight bearing status;Precautions PT Visit Diagnosis: Unsteadiness on feet (R26.81);Other abnormalities of gait  and mobility (R26.89);Muscle weakness (generalized) (M62.81);Difficulty in walking, not elsewhere classified (R26.2);Pain Pain - Right/Left: Right Pain - part of body: Shoulder;Knee     Time: 1660-6301 PT Time Calculation (min) (ACUTE ONLY): 40 min  Charges:  $Gait Training: 8-22 mins $Therapeutic Activity: 23-37 mins                    G Codes:       Jonai Weyland B. Migdalia Dk PT, DPT Acute Rehabilitation  [phone] Pager [phone]     [place] 02/02/2017, 4:27 PM

## 2017-02-02 NOTE — Social Work (Signed)
CSW f/u on placements. CSW was advised by [employer] [facility] and [facility] that they are out of network with [employer].  CSW called [facility] and left message with admissions.  [facility] has accepted patient. CSW advised patient of same.

## 2017-02-02 NOTE — Progress Notes (Signed)
Orthopedics Progress Note  Subjective: Patient still complaining of epigastric pain, but eating a little bit better.  Objective:  Vitals:   02/01/17 2039 02/02/17 0600  BP: (!) 142/53 (!) 137/59  Pulse: 84 75  Resp: 16 16  Temp: 98.1 F (36.7 C) 98.6 F (37 C)    General: Awake and alert  Musculoskeletal: right shoulder dressing CDI, NVI, Abdomen still soft and min tender Neurovascularly intact  Lab Results  Component Value Date   WBC 9.9 01/29/2017   HGB 11.7 (L) 01/30/2017   HCT 37.0 (L) 01/30/2017   MCV 97.9 01/29/2017   PLT 144 (L) 01/29/2017       Component Value Date/Time   NA 136 01/31/2017 0515   K 4.2 01/31/2017 0515   CL 102 01/31/2017 0515   CO2 24 01/31/2017 0515   GLUCOSE 123 (H) 01/31/2017 0515   BUN 42 (H) 01/31/2017 0515   CREATININE 2.20 (H) 01/31/2017 0515   CALCIUM 8.0 (L) 01/31/2017 0515   GFRNONAA 25 (L) 01/31/2017 0515   GFRAA 29 (L) 01/31/2017 0515    No results found for: INR, PROTIME  Assessment/Plan: POD #4 s/p Procedure(s): REVERSE RIGHT SHOULDER ARTHROPLASTY Appreciate medicine consult and PT,OT OK to WBAT for use of right arm with walker or cane for safe ambulation - do not want patient pushing up with full weight out of chair Continue medical work up and treatment for epigasrtic pain. KUB showed nonspecific bowel gas pattern. Labs ordered this morning. Check creatinine to see if fluid bolus helped. Mobilization and therapy - d/c planning. SNF vs home with robust home health support thanks  Doran Heater. Veverly Fells, MD 02/02/2017 7:48 AM

## 2017-02-02 NOTE — Progress Notes (Signed)
Occupational Therapy Treatment Patient Details Name: Kristopher Ayers MRN: [id number] DOB: 1925-09-29 Today's Date: 02/02/2017    History of present illness Pt is an 83 y.o. male s/p R reverse TSA. PMHx: CAD, COPD, HTN, Ankle fusion, CABG, Lumbar laminectomy x3, Rotator cuff repair x2, L TKA.   OT comments  Provided education to pt and wife on dressing including UB dressing techniques and shoulder precautions. Pt's wife assisted with donning of pants, shirt, and sling. She required Mod-Max VCs throughout for safe technique. During sit<>stand transfers for LB dressing, pt required Min A to maintain standing balance. Reviewed shoulder exercises and pt was able to recall 2/3 with Min VCs. Continue to recommend dc to post acute rehab to increase pt's safety and independence prior to transitioning home.    Follow Up Recommendations  SNF;Supervision/Assistance - 24 hour    Equipment Recommendations  3 in 1 bedside commode    Recommendations for Other Services PT consult    Precautions / Restrictions Precautions Precautions: Fall;Shoulder Type of Shoulder Precautions: Active protocol: AROM elbow/wrist/hand OK. A/PROM shoulder: FF 90, ABD 60, ER 30. Shoulder Interventions: Shoulder sling/immobilizer;For comfort (and for sleep) Precaution Booklet Issued: Yes (comment) Precaution Comments: Reviewed precautions with pt Required Braces or Orthoses: Sling Restrictions Weight Bearing Restrictions: Yes RUE Weight Bearing: Weight bearing as tolerated ; only on RW for balance      Mobility Bed Mobility Overal bed mobility: Needs Assistance Bed Mobility: Supine to Sit;Sit to Supine     Supine to sit: Min guard Sit to supine: Min guard   General bed mobility comments: Pt in recliner upon arrival  Transfers Overall transfer level: Needs assistance Equipment used: Rolling walker (2 wheeled) Transfers: Sit to/from Stand Sit to Stand: Min guard         General transfer comment: Pt  performed sit<>stand with several attempts.     Balance Overall balance assessment: Needs assistance Sitting-balance support: Feet supported;No upper extremity supported Sitting balance-Leahy Scale: Good     Standing balance support: Single extremity supported Standing balance-Leahy Scale: Poor Standing balance comment: major LOB secondary to R knee buckling requiring maxA to steady.                            ADL either performed or assessed with clinical judgement   ADL Overall ADL's : Needs assistance/impaired                 Upper Body Dressing : Sitting;Min guard;Cueing for safety;Cueing for sequencing;Cueing for UE precautions;With caregiver independent assisting Upper Body Dressing Details (indicate cue type and reason): Educated pt's wife on UB dressing and maintaining precautions. Pt's wife performed UB dressing with VCs to maintain safe ROM and use good technique Lower Body Dressing: Sit to/from stand;Minimal assistance;With caregiver independent assisting;Cueing for safety;Cueing for sequencing Lower Body Dressing Details (indicate cue type and reason): Pt requires Min A to maintain standing balance while wife performs LB dressing     Toileting- Clothing Manipulation and Hygiene: Minimal assistance;Sit to/from stand Toileting - Clothing Manipulation Details (indicate cue type and reason): Pt performed toilet hygiene after accident during LB dressing. Pt demosntrating urinary urgency. Reports Min A to maintain standing balance and VCs not to rely on RUE for support       General ADL Comments: Reviewed dressing techqniues, shoulder precautions, and excercises.      Vision       Perception     Praxis  Cognition Arousal/Alertness: Awake/alert Behavior During Therapy: WFL for tasks assessed/performed Overall Cognitive Status: Within Functional Limits for tasks assessed                                          Exercises  Exercises: Shoulder Shoulder Exercises Shoulder Flexion:  (0-90 degrees) Shoulder ABduction:  (0-60 degrees) Shoulder External Rotation:  (able to acheive neutral) Elbow Flexion:  (full range) Wrist Flexion:  (full range) Digit Composite Flexion:  (full range)   Shoulder Instructions Shoulder Instructions Donning/doffing shirt without moving shoulder: Maximal assistance;Caregiver independent with task Donning/doffing sling/immobilizer: Maximal assistance;Caregiver independent with task (Caregiver requiring Max VCs to don sling. Initially, pt's wife attempted to wrap sling strap around pt's neck. She required further cueing to correctly apply sling and position pt's arm.) Correct positioning of sling/immobilizer: Maximal assistance;Caregiver independent with task ROM for elbow, wrist and digits of operated UE: Minimal assistance (cues for initiation of exercises) Sling wearing schedule (on at all times/off for ADL's): Supervision/safety (eduated pt)     General Comments Wife, son-in-law, and daughter present at end of session. Discussed current dc recommendation.     Pertinent Vitals/ Pain       Pain Assessment: Faces Faces Pain Scale: Hurts little more Pain Location: R shoulder and R knee Pain Descriptors / Indicators: Aching;Sore Pain Intervention(s): Monitored during session  Home Living Family/patient expects to be discharged to:: Private residence Living Arrangements: Spouse/significant other Available Help at Discharge: Family;Available 24 hours/day Type of Home: House Home Access: Stairs to enter [employer] of Steps: 1   Home Layout: One level     Bathroom Shower/Tub: Teacher, early years/pre: Standard Bathroom Accessibility: No   Home Equipment: Environmental consultant - 2 wheels;Walker - 4 wheels          Prior Functioning/Environment Level of Independence: Needs assistance  Gait / Transfers Assistance Needed: RW for all mobility ADL's / Homemaking  Assistance Needed: Wife assisting with sponge bathing and dressing Communication / Swallowing Assistance Needed: HOH     Frequency  Min 3X/week        Progress Toward Goals  OT Goals(current goals can now be found in the care plan section)  Progress towards OT goals: Progressing toward goals  Acute Rehab OT Goals Patient Stated Goal: return home OT Goal Formulation: With patient Time For Goal Achievement: 02/13/17 Potential to Achieve Goals: Good ADL Goals Pt Will Perform Upper Body Bathing: with min assist;with caregiver independent in assisting;sitting Pt Will Perform Upper Body Dressing: with min assist;with caregiver independent in assisting;sitting Pt Will Transfer to Toilet: with min guard assist;ambulating;bedside commode Additional ADL Goal #1: Pt/caregiver will independently don/doff sling as precursor to ADL and mobility. Additional ADL Goal #2: Pt/caregiver will demonstrate independence with RUE ROM exercises.  Plan Discharge plan remains appropriate    Co-evaluation                 AM-PAC PT "6 Clicks" Daily Activity     Outcome Measure   Help from another person eating meals?: A Little Help from another person taking care of personal grooming?: A Lot Help from another person toileting, which includes using toliet, bedpan, or urinal?: A Lot Help from another person bathing (including washing, rinsing, drying)?: A Lot Help from another person to put on and taking off regular upper body clothing?: A Lot Help from another person to put on and taking  off regular lower body clothing?: A Lot 6 Click Score: 13    End of Session Equipment Utilized During Treatment: Oxygen;Gait belt;Other (comment) (sling)  OT Visit Diagnosis: Unsteadiness on feet (R26.81);Other abnormalities of gait and mobility (R26.89);Muscle weakness (generalized) (M62.81);Pain Pain - Right/Left: Right Pain - part of body: Shoulder   Activity Tolerance Patient tolerated treatment well    Patient Left in chair;with call bell/phone within reach;with family/visitor present;with nursing/sitter in room   Nurse Communication Mobility status;Weight bearing status;Precautions        Time: 5110-2111 OT Time Calculation (min): 36 min  Charges: OT General Charges $OT Visit: 1 Procedure OT Treatments $Self Care/Home Management : 23-37 mins  [place], OTR/L [place] 02/02/2017, 4:52 PM

## 2017-02-03 ENCOUNTER — Encounter (HOSPITAL_COMMUNITY): Payer: Self-pay | Admitting: General Practice

## 2017-02-03 ENCOUNTER — Inpatient Hospital Stay (HOSPITAL_COMMUNITY): Payer: Medicare HMO

## 2017-02-03 DIAGNOSIS — M7989 Other specified soft tissue disorders: Secondary | ICD-10-CM

## 2017-02-03 DIAGNOSIS — N189 Chronic kidney disease, unspecified: Secondary | ICD-10-CM

## 2017-02-03 DIAGNOSIS — N179 Acute kidney failure, unspecified: Secondary | ICD-10-CM

## 2017-02-03 LAB — URINALYSIS, ROUTINE W REFLEX MICROSCOPIC
BACTERIA UA: NONE SEEN
Bilirubin Urine: NEGATIVE
Glucose, UA: NEGATIVE mg/dL
HGB URINE DIPSTICK: NEGATIVE
Ketones, ur: NEGATIVE mg/dL
Leukocytes, UA: NEGATIVE
NITRITE: NEGATIVE
Protein, ur: 100 mg/dL — AB
SPECIFIC GRAVITY, URINE: 1.013 (ref 1.005–1.030)
Squamous Epithelial / LPF: NONE SEEN
pH: 5 (ref 5.0–8.0)

## 2017-02-03 LAB — BASIC METABOLIC PANEL
Anion gap: 8 (ref 5–15)
Anion gap: 7 (ref 5–15)
BUN: 32 mg/dL — ABNORMAL HIGH (ref 6–20)
BUN: 35 mg/dL — ABNORMAL HIGH (ref 6–20)
CALCIUM: 8.4 mg/dL — AB (ref 8.9–10.3)
CALCIUM: 8.6 mg/dL — AB (ref 8.9–10.3)
CO2: 24 mmol/L (ref 22–32)
CO2: 25 mmol/L (ref 22–32)
CREATININE: 1.43 mg/dL — AB (ref 0.61–1.24)
CREATININE: 1.31 mg/dL — AB (ref 0.61–1.24)
Chloride: 102 mmol/L (ref 101–111)
Chloride: 100 mmol/L — ABNORMAL LOW (ref 101–111)
GFR calc Af Amer: 49 mL/min — ABNORMAL LOW (ref >60)
GFR, EST AFRICAN AMERICAN: 54 mL/min — AB (ref >60)
GFR, EST NON AFRICAN AMERICAN: 42 mL/min — AB (ref >60)
GFR, EST NON AFRICAN AMERICAN: 47 mL/min — AB (ref >60)
Glucose, Bld: 107 mg/dL — ABNORMAL HIGH (ref 65–99)
Glucose, Bld: 119 mg/dL — ABNORMAL HIGH (ref 65–99)
POTASSIUM: 4 mmol/L (ref 3.5–5.1)
Potassium: 4.1 mmol/L (ref 3.5–5.1)
SODIUM: 132 mmol/L — AB (ref 135–145)
Sodium: 134 mmol/L — ABNORMAL LOW (ref 135–145)

## 2017-02-03 LAB — HEPATIC FUNCTION PANEL
ALT: 11 U/L — ABNORMAL LOW (ref 17–63)
AST: 19 U/L (ref 15–41)
Albumin: 2.7 g/dL — ABNORMAL LOW (ref 3.5–5.0)
Alkaline Phosphatase: 50 U/L (ref 38–126)
Bilirubin, Direct: 0.2 mg/dL (ref 0.1–0.5)
Indirect Bilirubin: 0.8 mg/dL (ref 0.3–0.9)
TOTAL PROTEIN: 5.5 g/dL — AB (ref 6.5–8.1)
Total Bilirubin: 1 mg/dL (ref 0.3–1.2)

## 2017-02-03 LAB — CBC
HCT: 29 % — ABNORMAL LOW (ref 39.0–52.0)
HCT: 31.6 % — ABNORMAL LOW (ref 39.0–52.0)
HEMOGLOBIN: 9.2 g/dL — AB (ref 13.0–17.0)
Hemoglobin: 10.1 g/dL — ABNORMAL LOW (ref 13.0–17.0)
MCH: 30.9 pg (ref 26.0–34.0)
MCH: 30.6 pg (ref 26.0–34.0)
MCHC: 31.7 g/dL (ref 30.0–36.0)
MCHC: 32 g/dL (ref 30.0–36.0)
MCV: 97.3 fL (ref 78.0–100.0)
MCV: 95.8 fL (ref 78.0–100.0)
PLATELETS: 150 10*3/uL (ref 150–400)
PLATELETS: 159 10*3/uL (ref 150–400)
RBC: 2.98 MIL/uL — AB (ref 4.22–5.81)
RBC: 3.3 MIL/uL — AB (ref 4.22–5.81)
RDW: 13.4 % (ref 11.5–15.5)
RDW: 13.4 % (ref 11.5–15.5)
WBC: 6.5 10*3/uL (ref 4.0–10.5)
WBC: 5.9 10*3/uL (ref 4.0–10.5)

## 2017-02-03 LAB — GLUCOSE, CAPILLARY
GLUCOSE-CAPILLARY: 101 mg/dL — AB (ref 65–99)
GLUCOSE-CAPILLARY: 118 mg/dL — AB (ref 65–99)
GLUCOSE-CAPILLARY: 107 mg/dL — AB (ref 65–99)

## 2017-02-03 LAB — LACTIC ACID, PLASMA: LACTIC ACID, VENOUS: 0.9 mmol/L (ref 0.5–1.9)

## 2017-02-03 LAB — MAGNESIUM: Magnesium: 1.7 mg/dL (ref 1.7–2.4)

## 2017-02-03 LAB — AMMONIA: AMMONIA: 20 umol/L (ref 9–35)

## 2017-02-03 LAB — TROPONIN I: Troponin I: 0.06 ng/mL (ref <0.03)

## 2017-02-03 MED ORDER — ENSURE ENLIVE PO LIQD
237.0000 mL | Freq: Two times a day (BID) | ORAL | Status: DC
  Administered 2017-02-04 – 2017-02-06 (×5): 237 mL via ORAL

## 2017-02-03 MED ORDER — SUCRALFATE 1 GM/10ML PO SUSP: 1.0000 g | Freq: Three times a day (TID) | ORAL | 0 refills | Status: DC

## 2017-02-03 MED ORDER — FERROUS SULFATE 300 (60 FE) MG/5ML PO SYRP: 300.0000 mg | ORAL_SOLUTION | Freq: Two times a day (BID) | ORAL | 1 refills | Status: DC

## 2017-02-03 MED ORDER — ACETAMINOPHEN 325 MG PO TABS: 650.0000 mg | ORAL_TABLET | Freq: Four times a day (QID) | ORAL | 0 refills | Status: DC | PRN

## 2017-02-03 MED ORDER — MAGNESIUM SULFATE IN D5W 1-5 GM/100ML-% IV SOLN
1.0000 g | Freq: Once | INTRAVENOUS | Status: AC
  Administered 2017-02-03: 1 g via INTRAVENOUS
  Filled 2017-02-03: qty 100

## 2017-02-03 MED ORDER — PANTOPRAZOLE SODIUM 40 MG PO TBEC: 40.0000 mg | DELAYED_RELEASE_TABLET | Freq: Two times a day (BID) | ORAL | 0 refills | Status: DC

## 2017-02-03 NOTE — Progress Notes (Addendum)
CONSULT PROGRESS NOTE    Kristopher Ayers  NAT:[id number] DOB: [birth year] DOA: 01/29/2017 PCP: Lilian Coma., MD    Brief Narrative: Kristopher Ayers is a 83 year old male with past medical history of hypertension, hyperlipidemia, COPD, chronic kidney disease and CAD s/p CABG. He has been admitted for elective right shoulder reverse total arthoplasty. [facility] have been consulted for medical management of his renal function and chronic, intermittent episodes of dysphagia to solids and liquids with associated epigastric abdominal pain.  Assessment & Plan: # Right shoulder joint osteoarthritis s/p elective right shoulder arthroplasty 01/29/2017: Postop day 5. Further management as per primary team. They ordered Doppler ultrasound.  # Abdominal pain: Suspect symptoms are related to GERD/gastritis. Symptoms improving. KUB 02/01/2017 showed no concerning pathology. Continue PPI and Carafate.  # Acute on Chronic Kidney Disease, stage III: Creatinine at 1.43. According to past labs patients baseline ranges 1.3-1.6.  -Serum creatinine level around baseline. Continue to monitor BMP. Avoid nephrotoxins. Recommend to monitor labs with PCP outpatient.  # Hypertension: Monitor blood pressure. Avoid hypotensive episode. Continue home medications including chlorthalidone, Avapro, metoprolol and follow. Monitor electrolytes while on chlorthalidone as patient has mild hyponatremia.  # Dysphagia: Symptoms improving and able to tolerate diet. Patient denied problem with swallowing. Recommended outpatient swallow evaluation at skilled facility.  # CAD (coronary artery disease): Stable without anginal symptoms. Continue ASA and beta blocker.  # Bradycardia: HR stable and returning to baseline. Asymptomatic.  #COPD: Stable without symptoms. Continue nebulizer and Breo inhaler.    DVT prophylaxis: as per primary team Code Status:full Family Communication: Discussed with the patient's wife and son  at bedside. Disposition Plan: As per primary team.   Consultants:   [facility]   Procedures:  Right shoulder reverse total arthroplasty per Dr. Veverly Fells 01/29/2017  Abdominal XR 02/01/2017  Antimicrobials:   None  Subjective: Patient's abdominal pain and swallowing have improved. He states the swelling in his right hand has also decreased since yesterday. Denied headache, dizziness, chest pain, shortness of breath.  Objective: Vitals:   02/02/17 0600 02/02/17 0856 02/02/17 1910 02/03/17 0438  BP: (!) 137/59  (!) 141/47 (!) 158/42  Pulse: 75  (!) 35 (!) 58  Resp: 16   20  Temp: 98.6 F (37 C)  99.4 F (37.4 C) 98.9 F (37.2 C)  TempSrc: Oral  Oral Oral  SpO2: 97% 98% 94% 100%  Weight:      Height:        Intake/Output Summary (Last 24 hours) at 02/03/17 1034 Last data filed at 02/02/17 1439  Gross per 24 hour  Intake                0 ml  Output              200 ml  Net             -200 ml   Filed Weights   01/29/17 0832  Weight: 93.4 kg (206 lb)    Examination:  General exam: Appears calm and comfortable  Respiratory system: Clear to auscultation. Respiratory effort normal. Cardiovascular system: S1 & S2 heard, RRR.  Gastrointestinal system: Abdomen is nondistended, soft and nontender. Normal bowel sounds heard. Central nervous system: Alert Awake and following commands Extremities: Right upper extremity was swelling. No lower extremity edema. Skin: No rashes, lesions or ulcers Psychiatry: Judgement and insight appear normal. Mood & affect age appropriate.     Data Reviewed: I have personally reviewed following labs and  imaging studies  CBC:  Recent Labs Lab 01/29/17 1744 01/30/17 0634 02/02/17 0818 02/03/17 0527  WBC 9.9  --  6.1 6.5  NEUTROABS  --   --  4.0  --   HGB 12.0* 11.7* 9.6* 9.2*  HCT 37.5* 37.0* 30.3* 29.0*  MCV 97.9  --  97.4 97.3  PLT 144*  --  141* 086   Basic Metabolic Panel:  Recent Labs Lab 01/29/17 1744  01/30/17 0634 01/31/17 0515 02/02/17 0818 02/03/17 0527  NA 139 138 136 137 134*  K 5.0 4.8 4.2 4.5 4.0  CL 108 106 102 105 102  CO2 23 21* 24 24 24   GLUCOSE 130* 161* 123* 126* 107*  BUN 36* 36* 42* 33* 32*  CREATININE 1.45* 1.59* 2.20* 1.31* 1.43*  CALCIUM 8.6* 8.3* 8.0* 8.4* 8.4*  MG 1.7  --   --   --   --    GFR: Estimated Creatinine Clearance: 36.8 mL/min (A) (by C-G formula based on SCr of 1.43 mg/dL (H)). Liver Function Tests: No results for input(s): AST, ALT, ALKPHOS, BILITOT, PROT, ALBUMIN in the last 168 hours. No results for input(s): LIPASE, AMYLASE in the last 168 hours. No results for input(s): AMMONIA in the last 168 hours. Coagulation Profile: No results for input(s): INR, PROTIME in the last 168 hours. Cardiac Enzymes: No results for input(s): CKTOTAL, CKMB, CKMBINDEX, TROPONINI in the last 168 hours. BNP (last 3 results) No results for input(s): PROBNP in the last 8760 hours. HbA1C: No results for input(s): HGBA1C in the last 72 hours. CBG:  Recent Labs Lab 02/02/17 0759 02/02/17 1217 02/02/17 1726 02/03/17 0604  GLUCAP 104* 132* 114* 101*   Lipid Profile: No results for input(s): CHOL, HDL, LDLCALC, TRIG, CHOLHDL, LDLDIRECT in the last 72 hours. Thyroid Function Tests: No results for input(s): TSH, T4TOTAL, FREET4, T3FREE, THYROIDAB in the last 72 hours. Anemia Panel: No results for input(s): VITAMINB12, FOLATE, FERRITIN, TIBC, IRON, RETICCTPCT in the last 72 hours. Sepsis Labs: No results for input(s): PROCALCITON, LATICACIDVEN in the last 168 hours.  No results found for this or any previous visit (from the past 240 hour(s)).       Radiology Studies: Dg Abd Portable 1v  Result Date: 02/01/2017 CLINICAL DATA:  Abdominal pain and distention, difficulty swallowing foods occurring the family, history hypertension, hyperlipidemia, kidney stones, COPD, chronic kidney disease, coronary artery disease, colonic diverticulosis EXAM: PORTABLE ABDOMEN  - 1 VIEW COMPARISON:  CT abdomen and pelvis 10/08/2009 FINDINGS: Scattered gas throughout air-filled nondistended colon. Small bowel gas pattern normal. No bowel dilatation or bowel wall thickening. Bones appear slightly demineralized with evidence of prior lumbar fusion. No definite urinary tract calcification. IMPRESSION: Nonspecific bowel gas pattern. Electronically Signed   By: Lavonia Dana M.D.   On: 02/01/2017 17:02        Scheduled Meds: . allopurinol  100 mg Oral Daily  . aspirin  81 mg Oral Daily  . chlorthalidone  12.5 mg Oral Daily  . docusate sodium  100 mg Oral BID  . DULoxetine  60 mg Oral Daily  . fluticasone  1 spray Each Nare Daily  . fluticasone furoate-vilanterol  1 puff Inhalation Daily  . irbesartan  75 mg Oral Daily  . metoprolol succinate  25 mg Oral QPM  . pantoprazole  40 mg Oral BID AC  . pravastatin  40 mg Oral q1800  . sucralfate  1 g Oral TID WC & HS   Continuous Infusions: . lactated ringers 10 mL/hr at 01/29/17 [phone]  LOS: 5 days    Lawson Radar, MD and Kathleene Hazel, [place]- [employer] [facility] Pager [phone]  If 7PM-7AM, please contact night-coverage [email or web address] Password [email or web address] 02/03/2017, 10:34 AM

## 2017-02-03 NOTE — Social Work (Addendum)
CSW called [facility] [place] to f/u on authorization.  CSW unable to reach and left voice message.   CSW sent DC summary to facility through hub.    CSW will continue to f/u.

## 2017-02-03 NOTE — Discharge Summary (Signed)
Physician Discharge Summary   Patient ID: Kristopher Ayers MRN: [id number] DOB/AGE: May 20, 1926 83 y.o.  Admit date: 01/29/2017 Discharge date: 02/03/2017  Admission Diagnoses:  Principal Problem:   S/P shoulder replacement, right Active Problems:   Hypertension   Hyperlipidemia   CAD (coronary artery disease)   Abdominal pain   AKI (acute kidney injury) ([place])   Epigastric abdominal pain   Hyperglycemia   Bradycardia   Essential hypertension   CKD (chronic kidney disease), stage III   Arthritis of shoulder region, right, degenerative   Discharge Diagnoses:  Same   Surgeries: Procedure(s): REVERSE RIGHT SHOULDER ARTHROPLASTY on 01/29/2017   Consultants: Internal Medicine, Cardiology, PT, OT, Social Work, Case Management  Discharged Condition: Stable  Hospital Course:  Kristopher Ayers is an 83 y.o. male who was admitted 01/29/2017 with a chief complaint of right shoulder pain, and found to have a diagnosis of right shoulder OA, rotator cuff insufficiency.  They were brought to the operating room on 01/29/2017 and underwent the above named procedures.    The patient had a hospital course complicated by bradycardia felt to be related to ventricular bigeminy and more of a spurious reading from the BP cuff and not clinically significant.  He also experienced post op increase in creatinine and reflux and dysphagia requiring internal medicine consult.  Those issues were treated by medicine and were clinically improved prior to discharge.  Due to the need for significant assistance with ADLs and also poor balance and high fall risk, therapy recommended short term SNF which the patient and wife did eventually agree to.  The patient was stable for discharge to SNF on 02/03/2017.   Recent vital signs:  Vitals:   02/02/17 1910 02/03/17 0438  BP: (!) 141/47 (!) 158/42  Pulse: (!) 35 (!) 58  Resp:  20  Temp: 99.4 F (37.4 C) 98.9 F (37.2 C)    Recent laboratory studies:  Results for orders  placed or performed during the hospital encounter of 01/29/17  Hemoglobin and hematocrit, blood  Result Value Ref Range   Hemoglobin 11.7 (L) 13.0 - 17.0 g/dL   HCT 37.0 (L) 39.0 - 42.5 %  Basic metabolic panel  Result Value Ref Range   Sodium 139 135 - 145 mmol/L   Potassium 5.0 3.5 - 5.1 mmol/L   Chloride 108 101 - 111 mmol/L   CO2 23 22 - 32 mmol/L   Glucose, Bld 130 (H) 65 - 99 mg/dL   BUN 36 (H) 6 - 20 mg/dL   Creatinine, Ser 1.45 (H) 0.61 - 1.24 mg/dL   Calcium 8.6 (L) 8.9 - 10.3 mg/dL   GFR calc non Af Amer 41 (L) >60 mL/min   GFR calc Af Amer 48 (L) >60 mL/min   Anion gap 8 5 - 15  Basic metabolic panel  Result Value Ref Range   Sodium 138 135 - 145 mmol/L   Potassium 4.8 3.5 - 5.1 mmol/L   Chloride 106 101 - 111 mmol/L   CO2 21 (L) 22 - 32 mmol/L   Glucose, Bld 161 (H) 65 - 99 mg/dL   BUN 36 (H) 6 - 20 mg/dL   Creatinine, Ser 1.59 (H) 0.61 - 1.24 mg/dL   Calcium 8.3 (L) 8.9 - 10.3 mg/dL   GFR calc non Af Amer 37 (L) >60 mL/min   GFR calc Af Amer 43 (L) >60 mL/min   Anion gap 11 5 - 15  Magnesium  Result Value Ref Range   Magnesium 1.7 1.7 -  2.4 mg/dL  CBC  Result Value Ref Range   WBC 9.9 4.0 - 10.5 K/uL   RBC 3.83 (L) 4.22 - 5.81 MIL/uL   Hemoglobin 12.0 (L) 13.0 - 17.0 g/dL   HCT 37.5 (L) 39.0 - 52.0 %   MCV 97.9 78.0 - 100.0 fL   MCH 31.3 26.0 - 34.0 pg   MCHC 32.0 30.0 - 36.0 g/dL   RDW 13.8 11.5 - 15.5 %   Platelets 144 (L) 150 - 400 K/uL  TSH  Result Value Ref Range   TSH 1.269 0.350 - 4.500 uIU/mL  Basic metabolic panel  Result Value Ref Range   Sodium 136 135 - 145 mmol/L   Potassium 4.2 3.5 - 5.1 mmol/L   Chloride 102 101 - 111 mmol/L   CO2 24 22 - 32 mmol/L   Glucose, Bld 123 (H) 65 - 99 mg/dL   BUN 42 (H) 6 - 20 mg/dL   Creatinine, Ser 2.20 (H) 0.61 - 1.24 mg/dL   Calcium 8.0 (L) 8.9 - 10.3 mg/dL   GFR calc non Af Amer 25 (L) >60 mL/min   GFR calc Af Amer 29 (L) >60 mL/min   Anion gap 10 5 - 15  Basic metabolic panel  Result Value Ref  Range   Sodium 137 135 - 145 mmol/L   Potassium 4.5 3.5 - 5.1 mmol/L   Chloride 105 101 - 111 mmol/L   CO2 24 22 - 32 mmol/L   Glucose, Bld 126 (H) 65 - 99 mg/dL   BUN 33 (H) 6 - 20 mg/dL   Creatinine, Ser 1.31 (H) 0.61 - 1.24 mg/dL   Calcium 8.4 (L) 8.9 - 10.3 mg/dL   GFR calc non Af Amer 47 (L) >60 mL/min   GFR calc Af Amer 54 (L) >60 mL/min   Anion gap 8 5 - 15  CBC with Differential/Platelet  Result Value Ref Range   WBC 6.1 4.0 - 10.5 K/uL   RBC 3.11 (L) 4.22 - 5.81 MIL/uL   Hemoglobin 9.6 (L) 13.0 - 17.0 g/dL   HCT 30.3 (L) 39.0 - 52.0 %   MCV 97.4 78.0 - 100.0 fL   MCH 30.9 26.0 - 34.0 pg   MCHC 31.7 30.0 - 36.0 g/dL   RDW 13.7 11.5 - 15.5 %   Platelets 141 (L) 150 - 400 K/uL   Neutrophils Relative % 66 %   Neutro Abs 4.0 1.7 - 7.7 K/uL   Lymphocytes Relative 20 %   Lymphs Abs 1.2 0.7 - 4.0 K/uL   Monocytes Relative 10 %   Monocytes Absolute 0.6 0.1 - 1.0 K/uL   Eosinophils Relative 4 %   Eosinophils Absolute 0.3 0.0 - 0.7 K/uL   Basophils Relative 0 %   Basophils Absolute 0.0 0.0 - 0.1 K/uL  Glucose, capillary  Result Value Ref Range   Glucose-Capillary 104 (H) 65 - 99 mg/dL  Glucose, capillary  Result Value Ref Range   Glucose-Capillary 132 (H) 65 - 99 mg/dL  Glucose, capillary  Result Value Ref Range   Glucose-Capillary 114 (H) 65 - 99 mg/dL  Basic metabolic panel  Result Value Ref Range   Sodium 134 (L) 135 - 145 mmol/L   Potassium 4.0 3.5 - 5.1 mmol/L   Chloride 102 101 - 111 mmol/L   CO2 24 22 - 32 mmol/L   Glucose, Bld 107 (H) 65 - 99 mg/dL   BUN 32 (H) 6 - 20 mg/dL   Creatinine, Ser 1.43 (H) 0.61 - 1.24 mg/dL  Calcium 8.4 (L) 8.9 - 10.3 mg/dL   GFR calc non Af Amer 42 (L) >60 mL/min   GFR calc Af Amer 49 (L) >60 mL/min   Anion gap 8 5 - 15  CBC  Result Value Ref Range   WBC 6.5 4.0 - 10.5 K/uL   RBC 2.98 (L) 4.22 - 5.81 MIL/uL   Hemoglobin 9.2 (L) 13.0 - 17.0 g/dL   HCT 29.0 (L) 39.0 - 52.0 %   MCV 97.3 78.0 - 100.0 fL   MCH 30.9 26.0 -  34.0 pg   MCHC 31.7 30.0 - 36.0 g/dL   RDW 13.4 11.5 - 15.5 %   Platelets 150 150 - 400 K/uL  Glucose, capillary  Result Value Ref Range   Glucose-Capillary 101 (H) 65 - 99 mg/dL    Discharge Medications:   Allergies as of 02/03/2017      Reactions   Irbesartan Cough   WEAKNESS   Nsaids Rash, Other (See Comments)   RENAL INSUFFICIENCY   Amlodipine Swelling   SWELLING REACTION UNSPECIFIED    Buprenorphine Hcl    UNSPECIFIED REACTION    Diazepam    UNSPECIFIED REACTION    Hydrocodone-acetaminophen Rash   Morphine And Related Rash      Medication List    TAKE these medications   acetaminophen 325 MG tablet Commonly known as:  TYLENOL Take 650 mg by mouth every 6 (six) hours as needed for moderate pain. What changed:  Another medication with the same name was added. Make sure you understand how and when to take each.   acetaminophen 325 MG tablet Commonly known as:  TYLENOL Take 2 tablets (650 mg total) by mouth every 6 (six) hours as needed for mild pain (or Fever >/= 101). What changed:  You were already taking a medication with the same name, and this prescription was added. Make sure you understand how and when to take each.   allopurinol 100 MG tablet Commonly known as:  ZYLOPRIM Take 100 mg by mouth daily.   aspirin 81 MG tablet Take 81 mg by mouth daily.   B-12 PO Take 1 tablet by mouth daily.   BREO ELLIPTA 100-25 MCG/INH Aepb Generic drug:  fluticasone furoate-vilanterol Inhale 1 puff into the lungs daily.   chlorthalidone 25 MG tablet Commonly known as:  HYGROTON Take 12.5 mg by mouth daily.   DULoxetine 60 MG capsule Commonly known as:  CYMBALTA Take 60 mg by mouth daily.   ferrous sulfate 300 (60 Fe) MG/5ML syrup Take 5 mLs (300 mg total) by mouth 2 (two) times daily with a meal.   fluticasone 50 MCG/ACT nasal spray Commonly known as:  FLONASE 1 spray by Each Nare route daily.   ipratropium 17 MCG/ACT inhaler Commonly known as:  ATROVENT  HFA Inhale 2 puffs into the lungs every 6 (six) hours as needed for wheezing.   metoprolol succinate 25 MG 24 hr tablet Commonly known as:  TOPROL-XL Take 25 mg by mouth every evening.   pantoprazole 40 MG tablet Commonly known as:  PROTONIX Take 1 tablet (40 mg total) by mouth 2 (two) times daily before a meal.   pravastatin 40 MG tablet Commonly known as:  PRAVACHOL Take 40 mg by mouth daily.   sucralfate 1 GM/10ML suspension Commonly known as:  CARAFATE Take 10 mLs (1 g total) by mouth 4 (four) times daily -  with meals and at bedtime.   traMADol 50 MG tablet Commonly known as:  ULTRAM Take 1 tablet (50  mg total) by mouth every 6 (six) hours as needed for moderate pain.   valsartan 80 MG tablet Commonly known as:  DIOVAN Take 80 mg by mouth daily.       Diagnostic Studies: Dg Shoulder Right Port  Result Date: 01/29/2017 CLINICAL DATA:  Status post right shoulder replacement. EXAM: PORTABLE RIGHT SHOULDER COMPARISON:  Radiographs of same day. FINDINGS: The glenoid and humeral components appear to be well situated. No fracture or dislocation is noted. Expected postoperative changes are seen in the surrounding soft tissues. IMPRESSION: Status post right shoulder arthroplasty. Electronically Signed   By: Marijo Conception, M.D.   On: 01/29/2017 14:09   Dg Shoulder Right Port  Result Date: 01/29/2017 CLINICAL DATA:  Preoperative exam for shoulder arthroplasty. EXAM: PORTABLE RIGHT SHOULDER COMPARISON:  12/16/2015 FINDINGS: Two views show advanced osteoarthritis of the glenohumeral joint with joint space narrowing and marginal osteophytes. Moderate narrowing of the humeral acromial distance. Previous distal clavicular resection. Regional ribs appear normal. IMPRESSION: Chronic osteoarthritis of the glenohumeral joint. Electronically Signed   By: Nelson Chimes M.D.   On: 01/29/2017 10:20   Dg Abd Portable 1v  Result Date: 02/01/2017 CLINICAL DATA:  Abdominal pain and distention,  difficulty swallowing foods occurring the family, history hypertension, hyperlipidemia, kidney stones, COPD, chronic kidney disease, coronary artery disease, colonic diverticulosis EXAM: PORTABLE ABDOMEN - 1 VIEW COMPARISON:  CT abdomen and pelvis 10/08/2009 FINDINGS: Scattered gas throughout air-filled nondistended colon. Small bowel gas pattern normal. No bowel dilatation or bowel wall thickening. Bones appear slightly demineralized with evidence of prior lumbar fusion. No definite urinary tract calcification. IMPRESSION: Nonspecific bowel gas pattern. Electronically Signed   By: Lavonia Dana M.D.   On: 02/01/2017 17:02    Disposition: SNF  Discharge Instructions    Call MD / Call 911    Complete by:  As directed    If you experience chest pain or shortness of breath, CALL 911 and be transported to the hospital emergency room.  If you develope a fever above 101 F, pus (white drainage) or increased drainage or redness at the wound, or calf pain, call your surgeon's office.   Constipation Prevention    Complete by:  As directed    Drink plenty of fluids.  Prune juice may be helpful.  You may use a stool softener, such as Colace (over the counter) 100 mg twice a day.  Use MiraLax (over the counter) for constipation as needed.   Diet - low sodium heart healthy    Complete by:  As directed    Increase activity slowly as tolerated    Complete by:  As directed       Follow-up Information    Netta Cedars, MD. Call in 10 day(s).   Specialty:  Orthopedic Surgery Why:  [phone] Contact information: [address] [place] [address] [phone]            Signed: Augustin Schooling 02/03/2017, 7:42 AM

## 2017-02-03 NOTE — Progress Notes (Signed)
Baltazar Najjar NP updated on pts BP, being AxO x4, mag results, orders received

## 2017-02-03 NOTE — Progress Notes (Signed)
Occupational Therapy Treatment Patient Details Name: Kristopher Ayers MRN: [id number] DOB: [birth year] Today's Date: 02/03/2017    History of present illness Pt is an 83 y.o. male s/p R reverse TSA. PMHx: CAD, COPD, HTN, Ankle fusion, CABG, Lumbar laminectomy x3, Rotator cuff repair x2, L TKA.   OT comments  Upon arrival, pt supine in bed using urinal. Pt wife assisted with toilet hygiene with pt at bed level. Throughout session, pt was very fatigued and had difficulty staying awake. Focused session on shoulder exercises and pt performed exercises with AAROM in supine; pt continues to have difficulty with forward shoulder flexion. Continue to recommend dc to SNF for pt safety and to increase his independence with ADLs and functional mobility.    Follow Up Recommendations  SNF;Supervision/Assistance - 24 hour    Equipment Recommendations  3 in 1 bedside commode    Recommendations for Other Services PT consult    Precautions / Restrictions Precautions Precautions: Fall;Shoulder Type of Shoulder Precautions: Active protocol: AROM elbow/wrist/hand OK. A/PROM shoulder: FF 90, ABD 60, ER 30. Shoulder Interventions: Shoulder sling/immobilizer;For comfort (and for sleep) Precaution Booklet Issued: Yes (comment) Precaution Comments: Reviewed precautions with pt Required Braces or Orthoses: Sling Restrictions Weight Bearing Restrictions: Yes RUE Weight Bearing: Weight bearing as tolerated       Mobility Bed Mobility               General bed mobility comments: Pt too fatigued to perform bed mobility. focused session on excercises in bed  Transfers                 General transfer comment: Focused session on excercises in bed    Balance                                           ADL either performed or assessed with clinical judgement   ADL Overall ADL's : Needs assistance/impaired                           Toileting- Clothing  Manipulation and Hygiene: Supervision/safety;Bed level;With caregiver independent assisting Toileting - Clothing Manipulation Details (indicate cue type and reason): Wife performed toilet hygiene after pt used urinal at bed level       General ADL Comments: Reviewed precautions and discussed dc plan     Vision       Perception     Praxis      Cognition Arousal/Alertness: Lethargic Behavior During Therapy: WFL for tasks assessed/performed Overall Cognitive Status: Within Functional Limits for tasks assessed                                 General Comments: Pt very fatigued and had difficulty staying awake        Exercises Exercises: Shoulder Shoulder Exercises Shoulder Flexion: AAROM;Supine;10 reps;Right (0-90 degrees; continued to have compensatory shoulder mvnt) Shoulder ABduction: AAROM;10 reps;Right;Supine (0-60 degrees) Shoulder External Rotation: AAROM;Right;10 reps;Supine (able to acheive neutral) Elbow Flexion: AAROM;Right;10 reps;Supine (full range) Wrist Flexion: AAROM;Right;10 reps;Supine (full range) Wrist Extension: AAROM;Right;10 reps;Supine Digit Composite Flexion: AROM;Right;5 reps;Supine (full range) Composite Extension: AROM;Right;10 reps;Supine   Shoulder Instructions     General Comments Wife and daughter present for session. Discussed dc plan. Family states they are waiting for insurance approval for dc to SNF.  Feel that this is the safety plan for pt    Pertinent Vitals/ Pain       Pain Assessment: Faces Faces Pain Scale: Hurts little more Pain Location: R shoulder and R knee Pain Descriptors / Indicators: Aching;Sore  Home Living Family/patient expects to be discharged to:: Private residence Living Arrangements: Spouse/significant other                                      Prior Functioning/Environment              Frequency  Min 3X/week        Progress Toward Goals  OT Goals(current goals can now  be found in the care plan section)  Progress towards OT goals: Progressing toward goals  Acute Rehab OT Goals Patient Stated Goal: return home OT Goal Formulation: With patient Time For Goal Achievement: 02/13/17 Potential to Achieve Goals: Good ADL Goals Pt Will Perform Upper Body Bathing: with min assist;with caregiver independent in assisting;sitting Pt Will Perform Upper Body Dressing: with min assist;with caregiver independent in assisting;sitting Pt Will Transfer to Toilet: with min guard assist;ambulating;bedside commode Additional ADL Goal #1: Pt/caregiver will independently don/doff sling as precursor to ADL and mobility. Additional ADL Goal #2: Pt/caregiver will demonstrate independence with RUE ROM exercises.  Plan Discharge plan remains appropriate    Co-evaluation    PT/OT/SLP Co-Evaluation/Treatment: Yes            AM-PAC PT "6 Clicks" Daily Activity     Outcome Measure   Help from another person eating meals?: A Little Help from another person taking care of personal grooming?: A Lot Help from another person toileting, which includes using toliet, bedpan, or urinal?: A Lot Help from another person bathing (including washing, rinsing, drying)?: A Lot Help from another person to put on and taking off regular upper body clothing?: A Lot Help from another person to put on and taking off regular lower body clothing?: A Lot 6 Click Score: 13    End of Session Equipment Utilized During Treatment:  (sling)  OT Visit Diagnosis: Unsteadiness on feet (R26.81);Other abnormalities of gait and mobility (R26.89);Muscle weakness (generalized) (M62.81);Pain Pain - Right/Left: Right Pain - part of body: Shoulder   Activity Tolerance Patient limited by fatigue   Patient Left with call bell/phone within reach;with family/visitor present;in bed   Nurse Communication Mobility status;Weight bearing status;Precautions        Time: 2119-4174 OT Time Calculation (min): 13  min  Charges: OT General Charges $OT Visit: 1 Procedure OT Treatments $Therapeutic Activity: 8-22 mins  [place], OTR/L [phone]   [place] 02/03/2017, 4:22 PM

## 2017-02-03 NOTE — Progress Notes (Signed)
Baltazar Najjar NP notified of trop 0.06

## 2017-02-03 NOTE — Progress Notes (Signed)
Orthopedics Progress Note  Subjective: I had a rough night last night and didn't sleep well Objective:  Vitals:   02/02/17 1910 02/03/17 0438  BP: (!) 141/47 (!) 158/42  Pulse: (!) 35 (!) 58  Resp:  20  Temp: 99.4 F (37.4 C) 98.9 F (37.2 C)    General: Awake and alert  Musculoskeletal: right arm swelling is a bit improved today - I think it may be due to the sling, no pain with PROM and AROM of the arm Neurovascularly intact  Lab Results  Component Value Date   WBC 6.5 02/03/2017   HGB 9.2 (L) 02/03/2017   HCT 29.0 (L) 02/03/2017   MCV 97.3 02/03/2017   PLT 150 02/03/2017       Component Value Date/Time   NA 134 (L) 02/03/2017 0527   K 4.0 02/03/2017 0527   CL 102 02/03/2017 0527   CO2 24 02/03/2017 0527   GLUCOSE 107 (H) 02/03/2017 0527   BUN 32 (H) 02/03/2017 0527   CREATININE 1.43 (H) 02/03/2017 0527   CALCIUM 8.4 (L) 02/03/2017 0527   GFRNONAA 42 (L) 02/03/2017 0527   GFRAA 49 (L) 02/03/2017 0527    No results found for: INR, PROTIME  Assessment/Plan: POD #5 s/p Procedure(s): REVERSE RIGHT SHOULDER ARTHROPLASTY Check R UE doppler US to rule out UE DVT, then cleared for Discharge to SNF Follow Up in 10 days with Dr Sharolyn Douglas to remove sling and use the arm as tolerated  Doran Heater. Veverly Fells, MD 02/03/2017 7:28 AM

## 2017-02-03 NOTE — Progress Notes (Signed)
*  Preliminary Results* Right upper extremity venous duplex completed. Right upper extremity is negative for deep and superficial vein thrombosis.  02/03/2017 3:52 PM  Maudry Mayhew, BS, RVT, RDCS, RDMS

## 2017-02-03 NOTE — Progress Notes (Signed)
Physical Therapy Treatment Patient Details Name: Kristopher Ayers MRN: [id number] DOB: 1926-07-13 Today's Date: 02/03/2017    History of Present Illness Pt is an 83 y.o. male s/p R reverse TSA. PMHx: CAD, COPD, HTN, Ankle fusion, CABG, Lumbar laminectomy x3, Rotator cuff repair x2, L TKA.    PT Comments    Pt is much more fatigued today and is unable to ambulate as far as he did yesterday. Pt also has difficulty gauging his fatigue. Pt required quick sit down into recliner following him during ambulation when he indicated that his R LE was giving out. Pt requires skilled PT in a SNF facility to progress gait training and to improve LE strength as well as to rehab his R shoulder to be able to ultimately safely mobilize in his home environment. PT will continue to follow him acutely.     Follow Up Recommendations  SNF;Supervision/Assistance - 24 hour     Equipment Recommendations  None recommended by PT    Recommendations for Other Services OT consult     Precautions / Restrictions Precautions Precautions: Fall;Shoulder Type of Shoulder Precautions: Active protocol: AROM elbow/wrist/hand OK. A/PROM shoulder: FF 90, ABD 60, ER 30. Shoulder Interventions: Shoulder sling/immobilizer;For comfort (and for sleep) Required Braces or Orthoses: Sling Restrictions Weight Bearing Restrictions: Yes RUE Weight Bearing: Weight bearing as tolerated    Mobility  Bed Mobility Overal bed mobility: Needs Assistance Bed Mobility: Supine to Sit;Sit to Supine     Supine to sit: Min guard Sit to supine: Min guard   General bed mobility comments: min guard for safety vc to not push through R UE to come to EoB  Transfers Overall transfer level: Needs assistance Equipment used: Rolling walker (2 wheeled) Transfers: Sit to/from Stand Sit to Stand: Min assist         General transfer comment: minA for power up after >5 times sit>stand from bed, able to sit<>stand from elevated 3 in 1 over toilet  wtihout assist, pt able to come to upright without vc for not using R UE  Ambulation/Gait Ambulation/Gait assistance: Mod assist Ambulation Distance (Feet): 50 Feet Assistive device: Rolling walker (2 wheeled) Gait Pattern/deviations: Step-through pattern;Decreased dorsiflexion - right;Decreased stance time - right;Antalgic;Trunk flexed;Shuffle Gait velocity: slowed  Gait velocity interpretation: Below normal speed for age/gender General Gait Details: pt much more fatigued today and not able to ambulate as far. Pt with decreased safety awareness of how fatigued his R LE becomes and required quick sit down into recliner          Balance Overall balance assessment: Needs assistance Sitting-balance support: Feet supported;No upper extremity supported Sitting balance-Leahy Scale: Good     Standing balance support: Single extremity supported Standing balance-Leahy Scale: Poor                              Cognition Arousal/Alertness: Awake/alert Behavior During Therapy: WFL for tasks assessed/performed Overall Cognitive Status: Within Functional Limits for tasks assessed                                           General Comments General comments (skin integrity, edema, etc.): Wife and son-in law both present for session.       Pertinent Vitals/Pain Pain Assessment: Faces Faces Pain Scale: Hurts a little bit Pain Location: R shoulder and R knee Pain Descriptors /  Indicators: Aching;Sore Pain Intervention(s): Monitored during session  VSS           PT Goals (current goals can now be found in the care plan section) Acute Rehab PT Goals Patient Stated Goal: return home PT Goal Formulation: With patient Time For Goal Achievement: 02/13/17 Potential to Achieve Goals: Fair    Frequency    Min 3X/week      PT Plan Current plan remains appropriate       AM-PAC PT "6 Clicks" Daily Activity  Outcome Measure  Difficulty turning over in  bed (including adjusting bedclothes, sheets and blankets)?: A Lot Difficulty moving from lying on back to sitting on the side of the bed? : A Lot Difficulty sitting down on and standing up from a chair with arms (e.g., wheelchair, bedside commode, etc,.)?: A Lot Help needed moving to and from a bed to chair (including a wheelchair)?: A Little Help needed walking in hospital room?: A Lot Help needed climbing 3-5 steps with a railing? : Total 6 Click Score: 12    End of Session Equipment Utilized During Treatment: Gait belt Activity Tolerance: Patient limited by fatigue;Other (comment) (bilateral LE weakness) Patient left: in chair;with call bell/phone within reach;with family/visitor present Nurse Communication: Mobility status;Weight bearing status;Precautions PT Visit Diagnosis: Unsteadiness on feet (R26.81);Other abnormalities of gait and mobility (R26.89);Muscle weakness (generalized) (M62.81);Difficulty in walking, not elsewhere classified (R26.2);Pain Pain - Right/Left: Right Pain - part of body: Shoulder;Knee     Time: 1610-9604 PT Time Calculation (min) (ACUTE ONLY): 34 min  Charges:  $Gait Training: 8-22 mins $Therapeutic Activity: 8-22 mins                    G Codes:       [name] B. Migdalia Dk PT, DPT Acute Rehabilitation  [phone] Pager [phone]     [place] 02/03/2017, 11:26 AM

## 2017-02-03 NOTE — Progress Notes (Signed)
OT Cancellation Note  Patient Details Name: Kristopher Ayers MRN: [id number] DOB: [birth year]   Cancelled Treatment:    Reason Eval/Treat Not Completed: Patient at procedure or test/ unavailable. Pt out of room for test. Will follow up as time allows.  Binnie Kand M.S., OTR/L Pager: [phone]  02/03/2017, 3:34 PM

## 2017-02-03 NOTE — Progress Notes (Addendum)
RN paged at beginning of shift because pt was very lethargic, only responsive to sternal rub. [facility] was consulted for medical issues including rising creatinine, abdominal pain, and bradycardia postop. Pt is s/p total shoulder replacement by ortho on 6/1.  NP ordered stat EKG and went to bedside. S: pt is now responsive to loud verbal cues. He denies CP, dizziness, HA, abd pain, or SOB. Doesn't remember "blacking out". No prodromal sx. Wants to know what all the fuss is for. Family at bedside-relay that pt has been very sleepy all day except for early am, but seems to have worsened since 3-4pm today. Per family, pt more alert yesterday. Per RN, pt has been sleepy, but responsive, but was more lethargic and unresponsive when NP called.  Per RN and family, nothing seemed to have precipitated event-no complaint by the pt. Per RN, bigeminy on tele, HR 37 on apical pulse.  O: Chronically ill appearing elderly WM in NAD. BP 160s. HR 70s. RR normal. O2 sat nromal. 2L placed for comfort. Opens eyes to loud name calling. Sleepy. Closes eyes after only a few seconds. Knows his name and that he is in [place]. Not oriented to time. Speech is slightly garbled but understandable. S1S2 with occasional irreg rhythm. Radial pulse irreg. Lungs CTA. Normal effort. Abd soft, non tender. Neuro: PERRL, follows some commands, but not others. Tongue protrusion is midline. Smile is symmetrical. Strength checked is 5/5 (grips, face, feet). Limited right shoulder exam due to surgery. RUE is edematous.  A/P: 1. Altered mental status-? Etiology. ? Loletha Grayer episode, but none reported per CMD. ? Due to arrythmia. Check CMP, Mg.  EKG with PVCs in bigeminy pattern, ventricular rate 74. His mental status is improving slowly during time at bedside.  AMS-? Infection-check CXR, CBC, UA, LA. Check ammonia, CT head.  2. Bigeminy with bradycardia after surgery-cardiology was consulted and stated brady was likely due to sedation of surgery. PVCs,  bigeminy per cards. Cards signed off and stated pt could have echo as outpt. Check lytes. TSH normal a few days ago. He is not due Metoprolol til 6/7 around 5pm, so will place hold on that for now.  3. RUE swelling-he went for dopplers today.  Will TF pt to SDU for closer observation. Awaiting tests and labs.  Family updated at bedside. Plan and possibilities for event discussed.  KJKG, NP [facility] Update: Mg 1.7, will go ahead and replace with one gram. K normal. Ammonia and LA normal. UA and CXR neg for infection. CT head pending. Per RN on 4N, pt is now A&O x 3. Still with PVCs, rate 70s. Will restart diet.  KJKG, NP [facility]  Total critical care time: 90 minutes Critical care time was exclusive of separately billable procedures and treating other patients. Critical care was necessary to treat or prevent imminent or life-threatening deterioration. Critical care was time spent personally by me on the following activities: development of treatment plan with patient and/or surrogate as well as nursing, discussions with consultants, evaluation of patient's response to treatment, examination of patient, obtaining history from patient or surrogate, ordering and performing treatments and interventions, ordering and review of laboratory studies, ordering and review of radiographic studies, pulse oximetry and re-evaluation of patient's condition.

## 2017-02-04 ENCOUNTER — Inpatient Hospital Stay (HOSPITAL_COMMUNITY): Payer: Medicare HMO

## 2017-02-04 DIAGNOSIS — R410 Disorientation, unspecified: Secondary | ICD-10-CM

## 2017-02-04 DIAGNOSIS — R7989 Other specified abnormal findings of blood chemistry: Secondary | ICD-10-CM

## 2017-02-04 DIAGNOSIS — R778 Other specified abnormalities of plasma proteins: Secondary | ICD-10-CM

## 2017-02-04 DIAGNOSIS — R748 Abnormal levels of other serum enzymes: Secondary | ICD-10-CM

## 2017-02-04 DIAGNOSIS — R1013 Epigastric pain: Secondary | ICD-10-CM

## 2017-02-04 LAB — TROPONIN I
TROPONIN I: 0.07 ng/mL — AB (ref <0.03)
TROPONIN I: 0.05 ng/mL — AB (ref <0.03)

## 2017-02-04 LAB — CBC
HCT: 29.1 % — ABNORMAL LOW (ref 39.0–52.0)
HEMOGLOBIN: 9.3 g/dL — AB (ref 13.0–17.0)
MCH: 30.7 pg (ref 26.0–34.0)
MCHC: 32 g/dL (ref 30.0–36.0)
MCV: 96 fL (ref 78.0–100.0)
Platelets: 161 10*3/uL (ref 150–400)
RBC: 3.03 MIL/uL — AB (ref 4.22–5.81)
RDW: 13.4 % (ref 11.5–15.5)
WBC: 5.9 10*3/uL (ref 4.0–10.5)

## 2017-02-04 LAB — BASIC METABOLIC PANEL
Anion gap: 10 (ref 5–15)
BUN: 29 mg/dL — ABNORMAL HIGH (ref 6–20)
CHLORIDE: 100 mmol/L — AB (ref 101–111)
CO2: 24 mmol/L (ref 22–32)
CREATININE: 1.17 mg/dL (ref 0.61–1.24)
Calcium: 8.7 mg/dL — ABNORMAL LOW (ref 8.9–10.3)
GFR calc non Af Amer: 54 mL/min — ABNORMAL LOW (ref >60)
Glucose, Bld: 105 mg/dL — ABNORMAL HIGH (ref 65–99)
POTASSIUM: 4 mmol/L (ref 3.5–5.1)
SODIUM: 134 mmol/L — AB (ref 135–145)

## 2017-02-04 LAB — GLUCOSE, CAPILLARY
GLUCOSE-CAPILLARY: 95 mg/dL (ref 65–99)
Glucose-Capillary: 160 mg/dL — ABNORMAL HIGH (ref 65–99)
Glucose-Capillary: 112 mg/dL — ABNORMAL HIGH (ref 65–99)
Glucose-Capillary: 110 mg/dL — ABNORMAL HIGH (ref 65–99)

## 2017-02-04 MED ORDER — METOPROLOL SUCCINATE ER 25 MG PO TB24
25.0000 mg | ORAL_TABLET | Freq: Every evening | ORAL | Status: DC
  Administered 2017-02-04: 25 mg via ORAL
  Filled 2017-02-04: qty 1

## 2017-02-04 NOTE — Care Management (Signed)
Pt will discharge to SNF with the assistance of CSW

## 2017-02-04 NOTE — Progress Notes (Signed)
Orthopedics Progress Note  Subjective: Patient feeling better this morning  Objective:  Vitals:   02/04/17 0400 02/04/17 0411  BP:  (!) 159/72  Pulse: 70 (!) 35  Resp: 15 16  Temp:  97.8 F (36.6 C)    General: Awake and alert  Musculoskeletal: right shoulder wound looks good, Aquacel in place, mod swelling Neurovascularly intact  Lab Results  Component Value Date   WBC 5.9 02/03/2017   HGB 10.1 (L) 02/03/2017   HCT 31.6 (L) 02/03/2017   MCV 95.8 02/03/2017   PLT 159 02/03/2017       Component Value Date/Time   NA 132 (L) 02/03/2017 1916   K 4.1 02/03/2017 1916   CL 100 (L) 02/03/2017 1916   CO2 25 02/03/2017 1916   GLUCOSE 119 (H) 02/03/2017 1916   BUN 35 (H) 02/03/2017 1916   CREATININE 1.31 (H) 02/03/2017 1916   CALCIUM 8.6 (L) 02/03/2017 1916   GFRNONAA 47 (L) 02/03/2017 1916   GFRAA 54 (L) 02/03/2017 1916    No results found for: INR, PROTIME  Assessment/Plan: POD #6 s/p Procedure(s): REVERSE RIGHT SHOULDER ARTHROPLASTY No evidence of right UE DVT by doppler Patient lethargic yesterday - patient moved to stepdown. Patient has been alert and oriented since arrival here Still awaiting insurance authorization for SNF as recommended by therapy, patient aware Medical workup for lethargy per [facility] -thank you, would need to clear him for safe discharge to SNF before ok to leave Continue OOB/PT, OT  Remo Lipps R. Veverly Fells, MD 02/04/2017 7:46 AM

## 2017-02-04 NOTE — Progress Notes (Signed)
Physical Therapy Treatment Patient Details Name: Kristopher Ayers MRN: [id number] DOB: [birth year] Today's Date: 02/04/2017    History of Present Illness Pt is an 83 y.o. male s/p R reverse TSA. PMHx: CAD, COPD, HTN, Ankle fusion, CABG, Lumbar laminectomy x3, Rotator cuff repair x2, L TKA.    PT Comments    Patient progressing well towards PT goals. Improved ambulation distance with Mod A for balance/safety. Pt with right knee instability and 1 episode of knee buckling into chair. Requires 2 seated rest breaks during gait training due to fatigue. Continues to require Mod A to power to standing due to weakness and inability to fully use RUE to assist- had difficulty standing prior to surgery. Appropriate for SNF. Will follow acutely.    Follow Up Recommendations  SNF;Supervision/Assistance - 24 hour     Equipment Recommendations  None recommended by PT    Recommendations for Other Services       Precautions / Restrictions Precautions Precautions: Fall;Shoulder Type of Shoulder Precautions: Active protocol: AROM elbow/wrist/hand OK. A/PROM shoulder: FF 90, ABD 60, ER 30. Shoulder Interventions: Shoulder sling/immobilizer;For comfort Precaution Booklet Issued: Yes (comment) Precaution Comments: Reviewed precautions with pt Required Braces or Orthoses: Sling Restrictions Weight Bearing Restrictions: Yes RUE Weight Bearing: Weight bearing as tolerated    Mobility  Bed Mobility               General bed mobility comments: Up in chair upon PT arrival.   Transfers Overall transfer level: Needs assistance Equipment used: Rolling walker (2 wheeled) Transfers: Sit to/from Stand Sit to Stand: Mod assist         General transfer comment: Assist to power to standing. Stood from chair x3, difficulty in mid range due to right knee weakness and inability to use RUE as needed.  Ambulation/Gait Ambulation/Gait assistance: Mod assist Ambulation Distance (Feet): 60 Feet (+3' +  12') Assistive device: Rolling walker (2 wheeled) Gait Pattern/deviations: Step-through pattern;Decreased dorsiflexion - right;Decreased stance time - right;Antalgic;Trunk flexed;Shuffle   Gait velocity interpretation: <1.8 ft/sec, indicative of risk for recurrent falls General Gait Details: Cues to decrease gait speed at times; right knee instability noted and buckling x1 into chair. 2 seated rest breaks. 2/4 DOE. HR stable.   Stairs            Wheelchair Mobility    Modified Rankin (Stroke Patients Only)       Balance Overall balance assessment: Needs assistance Sitting-balance support: Feet supported;No upper extremity supported Sitting balance-Leahy Scale: Good Sitting balance - Comments: Fatigues at times requiring back support esp after ambulation.   Standing balance support: During functional activity;Bilateral upper extremity supported Standing balance-Leahy Scale: Poor Standing balance comment: Reliant on BUEs for support in standing.                            Cognition Arousal/Alertness: Awake/alert Behavior During Therapy: WFL for tasks assessed/performed Overall Cognitive Status: Within Functional Limits for tasks assessed                                        Exercises General Exercises - Lower Extremity Long Arc Quad: Both;10 reps;Seated    General Comments General comments (skin integrity, edema, etc.): Daughter and son in law and wife present during session.      Pertinent Vitals/Pain Pain Assessment: 0-10 Pain Score: 2  Pain Location: R  shoulder and R knee Pain Descriptors / Indicators: Aching;Sore Pain Intervention(s): Monitored during session;Repositioned    Home Living                      Prior Function            PT Goals (current goals can now be found in the care plan section) Progress towards PT goals: Progressing toward goals    Frequency    Min 3X/week      PT Plan Current plan  remains appropriate    Co-evaluation              AM-PAC PT "6 Clicks" Daily Activity  Outcome Measure  Difficulty turning over in bed (including adjusting bedclothes, sheets and blankets)?: Total Difficulty moving from lying on back to sitting on the side of the bed? : Total Difficulty sitting down on and standing up from a chair with arms (e.g., wheelchair, bedside commode, etc,.)?: Total Help needed moving to and from a bed to chair (including a wheelchair)?: A Little Help needed walking in hospital room?: A Lot Help needed climbing 3-5 steps with a railing? : Total 6 Click Score: 9    End of Session Equipment Utilized During Treatment: Gait belt Activity Tolerance: Patient tolerated treatment well Patient left: in chair;with call bell/phone within reach;with family/visitor present Nurse Communication: Mobility status PT Visit Diagnosis: Unsteadiness on feet (R26.81);Other abnormalities of gait and mobility (R26.89);Muscle weakness (generalized) (M62.81);Difficulty in walking, not elsewhere classified (R26.2);Pain Pain - Right/Left: Right Pain - part of body: Shoulder;Knee     Time: 3568-6168 PT Time Calculation (min) (ACUTE ONLY): 25 min  Charges:  $Gait Training: 23-37 mins                    G Codes:       Wray Kearns, PT, DPT [phone]     Marguarite Arbour A Mairead Schwarzkopf 02/04/2017, 3:35 PM

## 2017-02-04 NOTE — Significant Event (Addendum)
Rapid Response Event Note RN called for decreased LOC  Overview: Time Called: 1900 Arrival Time: 1910 Event Type: Neurologic  Initial Focused Assessment: Pt lying supine in bed, sleeping but arousable to tactile stimuli, pt alert and oriented x3, skin warm and dry. BP 177/66, HR 70, RR 22, 96% RA. Baltazar Najjar NP paged PTA, new orders placed and completed   Interventions: EKG, CXR, UA sample, CT head, hepatic function, magnesium level, ammonia level, CBC, BMP, lactic acid, CBG. Pt transferred to 4N12   Event Summary: Name of Physician Notified: Baltazar Najjar NP  at  (pta RRT )  Name of Consulting Physician Notified: Dr Martinique at  (per Dr. Veverly Fells)  Outcome: Transferred (Comment)  Event End Time: 2035  Armen Pickup

## 2017-02-04 NOTE — Clinical Social Work Note (Signed)
Pt waiting on echo & cardiology. Discharge order removed. CSW spoke with Stanton Kidney at [facility], who is still awaiting insurance auth from [employer]. May confirmed with her director they will not accept an LOG for pt. CSW will assist with pt discharge when pt medically ready. CSW continuing to follow for discharge needs.   Oretha Ellis, [place], [place] Work [phone]

## 2017-02-04 NOTE — Progress Notes (Signed)
CONSULT PROGRESS NOTE    Kristopher Ayers  NKN:[id number] DOB: 26-Jun-1926 DOA: 01/29/2017 PCP: Lilian Coma., MD    Brief Narrative: Kristopher Ayers is an 83 year old male with past medical history of hypertension, hyperlipidemia, osteoarthritis, COPD, Stage III Chronic Kidney Disease and CaD s/p CABG. [facility] has been following the patient for medical management of renal function, lethargy and intermittent dysphagia with epigastric pain. The patient had episode of lethargy on the evening of 02/03/2017.  Assessment & Plan:  # Lethargy and change in mental status likely acute delirium: -UA negative for UTI, CT head with no acute finding. CBC and BMP stable. -Ammonia level acceptable. -Chest x-ray with possible atelectasis -EKG with PVCs. -Patient's mental status significantly improved and has no focal neurological deficit. He was alert awake and oriented 3. He has hard of hearing therefore need to speak louder. -Continue to monitor mental status. -Diet  # Elevated troponin level: This is a new finding. Troponin level is trending down. Patient has no chest pain. I be consulted cardiologist for evaluation of elevated troponin. Ordered echocardiogram.  -I reviewed the echocardiogram which was done on January 2017 at outside hospital which showed EF of 50-55% with normal LV function.  #PVCs/bigeminy, bradycardia:  -The patient was evaluated by cardiologist on 6/2, recommended to continue metoprolol. As per cardiologist "BP cuff will not measure the PVC beat and will display bradycardia in the setting of Bigeminy." -Continue metoprolol. -TSH acceptable.  # Right shoulder joint osteoarthritis S/P elective shoulder arthroplasty 01/29/2017: Post op day 6. Per primary team. Doppler was negative for DVT.  # Abdominal pain: Symptoms most likely due to GERD, have resolved. Continue PPI and Carafate.   # Acute on Chronic Kidney Disease, stage III: Creatinine 1.17. Monitor BMP. Avoid  nephrotoxins.   # Hypertension: BP returning to baseline. Continue home regimen of chlorthalidone, Avapro and metoprolol.    #Dysphagia: Patient tolerating diet and symptoms improving. Outpatient swallow evaluation suggested if he continues to have symptoms.  # CAD (coronary artery disease): Asymptomatic. Continue ASA and Beta blocker.  # COPD: Asymptomatic, breathing without difficulty. Continue nebulizer and Breo inhaler.  DVT prophylaxis: per surgery team. Code Status: full Family Communication: none at bedside Disposition Plan: May discharge to a skilled facility after an echo and cardiology evaluation.   Procedures:   Right shoulder reverse total arthroplasty per Dr. Veverly Fells 01/29/2017  Abdominal XR 02/01/2017  Right Upper Extremity Doppler U.S. 02/03/2017  Antimicrobials:   None  Subjective: Patient has no complaints at this time and is feeling much better. Denies chest pain, shortness of breath, nausea or abdominal pain. He was alert awake and oriented.  Objective: Vitals:   02/04/17 0001 02/04/17 0007 02/04/17 0400 02/04/17 0411  BP: (!) 153/129 (!) 153/73  (!) 159/72  Pulse: (!) 36 68 70 (!) 35  Resp: 17 17 15 16   Temp: 98.3 F (36.8 C)   97.8 F (36.6 C)  TempSrc: Oral   Oral  SpO2: 96% 96% 99% 99%  Weight:      Height:        Intake/Output Summary (Last 24 hours) at 02/04/17 1014 Last data filed at 02/04/17 0600  Gross per 24 hour  Intake                0 ml  Output             1150 ml  Net            -1150 ml  Filed Weights   01/29/17 0832  Weight: 93.4 kg (206 lb)    Examination:  General exam: Elderly male lying on bed comfortable, alert awake and hard of hearing. Not in distress  Respiratory system: Clear to auscultation. Respiratory effort normal. Cardiovascular system: S1 & S2 heard, RRR. No pedal edema. Gastrointestinal system: Abdomen is  soft and nontender. Normal bowel sounds heard. Central nervous system: Alert awake and oriented to  hospital, name and June 2018. Skin: No rashes, lesions or ulcers Psychiatry:  Mood & affect is AGE appropriate.    Data Reviewed: I have personally reviewed following labs and imaging studies  CBC:  Recent Labs Lab 01/29/17 1744 01/30/17 0634 02/02/17 0818 02/03/17 0527 02/03/17 1916 02/04/17 0855  WBC 9.9  --  6.1 6.5 5.9 5.9  NEUTROABS  --   --  4.0  --   --   --   HGB 12.0* 11.7* 9.6* 9.2* 10.1* 9.3*  HCT 37.5* 37.0* 30.3* 29.0* 31.6* 29.1*  MCV 97.9  --  97.4 97.3 95.8 96.0  PLT 144*  --  141* 150 159 683   Basic Metabolic Panel:  Recent Labs Lab 01/29/17 1744  01/31/17 0515 02/02/17 0818 02/03/17 0527 02/03/17 1916 02/03/17 1945 02/04/17 0855  NA 139  < > 136 137 134* 132*  --  134*  K 5.0  < > 4.2 4.5 4.0 4.1  --  4.0  CL 108  < > 102 105 102 100*  --  100*  CO2 23  < > 24 24 24 25   --  24  GLUCOSE 130*  < > 123* 126* 107* 119*  --  105*  BUN 36*  < > 42* 33* 32* 35*  --  29*  CREATININE 1.45*  < > 2.20* 1.31* 1.43* 1.31*  --  1.17  CALCIUM 8.6*  < > 8.0* 8.4* 8.4* 8.6*  --  8.7*  MG 1.7  --   --   --   --   --  1.7  --   < > = values in this interval not displayed. GFR: Estimated Creatinine Clearance: 45 mL/min (by C-G formula based on SCr of 1.17 mg/dL). Liver Function Tests:  Recent Labs Lab 02/03/17 1945  AST 19  ALT 11*  ALKPHOS 50  BILITOT 1.0  PROT 5.5*  ALBUMIN 2.7*   No results for input(s): LIPASE, AMYLASE in the last 168 hours.  Recent Labs Lab 02/03/17 1950  AMMONIA 20   Coagulation Profile: No results for input(s): INR, PROTIME in the last 168 hours. Cardiac Enzymes:  Recent Labs Lab 02/03/17 2102 02/04/17 0301 02/04/17 0855  TROPONINI 0.06* 0.07* 0.05*   BNP (last 3 results) No results for input(s): PROBNP in the last 8760 hours. HbA1C: No results for input(s): HGBA1C in the last 72 hours. CBG:  Recent Labs Lab 02/02/17 1726 02/03/17 0604 02/03/17 1738 02/03/17 1903 02/04/17 0923  GLUCAP 114* 101* 118* 107*  95   Lipid Profile: No results for input(s): CHOL, HDL, LDLCALC, TRIG, CHOLHDL, LDLDIRECT in the last 72 hours. Thyroid Function Tests: No results for input(s): TSH, T4TOTAL, FREET4, T3FREE, THYROIDAB in the last 72 hours. Anemia Panel: No results for input(s): VITAMINB12, FOLATE, FERRITIN, TIBC, IRON, RETICCTPCT in the last 72 hours. Sepsis Labs:  Recent Labs Lab 02/03/17 1950  LATICACIDVEN 0.9    No results found for this or any previous visit (from the past 240 hour(s)).       Radiology Studies: Ct Head Wo Contrast  Result Date: 02/03/2017  CLINICAL DATA:  Altered mental status EXAM: CT HEAD WITHOUT CONTRAST TECHNIQUE: Contiguous axial images were obtained from the base of the skull through the vertex without intravenous contrast. COMPARISON:  None. FINDINGS: Brain: No mass lesion, intraparenchymal hemorrhage or extra-axial collection. No evidence of acute cortical infarct. There is periventricular hypoattenuation compatible with chronic microvascular disease. Vascular: No hyperdense vessel or unexpected calcification. Skull: Normal visualized skull base, calvarium and extracranial soft tissues. Sinuses/Orbits: No sinus fluid levels or advanced mucosal thickening. No mastoid effusion. Normal orbits. IMPRESSION: No acute intracranial abnormality. Electronically Signed   By: Ulyses Jarred M.D.   On: 02/03/2017 22:32   Dg Chest Port 1 View  Result Date: 02/03/2017 CLINICAL DATA:  Lethargy EXAM: PORTABLE CHEST 1 VIEW COMPARISON:  12/16/2015 FINDINGS: Post sternotomy changes. Cardiomegaly with central vascular congestion. Increased opacity at the right base with probable adjacent small effusion. Aortic atherosclerosis. No pneumothorax. IMPRESSION: 1. Hazy infiltrate at the right base with small right pleural effusion. 2. Cardiomegaly with central vascular congestion. Electronically Signed   By: Donavan Foil M.D.   On: 02/03/2017 20:12        Scheduled Meds: . allopurinol  100 mg Oral  Daily  . aspirin  81 mg Oral Daily  . chlorthalidone  12.5 mg Oral Daily  . docusate sodium  100 mg Oral BID  . DULoxetine  60 mg Oral Daily  . feeding supplement (ENSURE ENLIVE)  237 mL Oral BID BM  . fluticasone  1 spray Each Nare Daily  . fluticasone furoate-vilanterol  1 puff Inhalation Daily  . irbesartan  75 mg Oral Daily  . pantoprazole  40 mg Oral BID AC  . pravastatin  40 mg Oral q1800  . sucralfate  1 g Oral TID WC & HS   Continuous Infusions: . lactated ringers 10 mL/hr at 01/29/17 0841     LOS: 6 days    Time spent: 25 minutes    Kathleene Hazel, PA-S [facility] Pager [phone]  If 7PM-7AM, please contact night-coverage [email or web address] Password [email or web address] 02/04/2017, 10:14 AM

## 2017-02-04 NOTE — Progress Notes (Signed)
Cardiology Consult    Patient ID: SHUAN STATZER MRN: [id number], DOB/AGE: 81-Aug-1927   Admit date: 01/29/2017 Date of Consult: 02/04/2017  Primary Physician: Lilian Coma., MD Reason for Consult: Elevated troponin Primary Cardiologist: Dr. Peter Martinique   History of Present Illness    CLEBERT WENGER is a 83 y.o. male who is being seen today for the evaluation of elevated troponin at the request of Dr. Veverly Fells. The patient has a past medical history significant for CAD s/p CABG 2001, stent to LCx and OM1 2016, COPD, HTN, hyperlipidemia, CKD stage 3 and remote smoking history 50 yrs ago. He has a positive family history of CAD. He was admitted on 6/1 for evaluation of lethargy and intermittent dysphagia with epigastric pain.   The patient had elective reverse right shoulder arthroscopy on 01/29/17. He became lethargic yesterday and was moved to step down. The patient was found to be negative for any kind of infection. [place] level was acceptable. Mental status is significantly improved. He denies chest pain, shortness of breath, palpitations or lightheadedness. His mental status has improved, back to normal. He denies any prior exertional chest pain, orthopnea, palpitations or dizziness. He does have chronic DOE that is unchanged.   Upon review of the notes from his last cardiology visit with Dr. Martinique on 10/09/16 it is noted that the patient had a CABG in [facility] in 2001. He did well until 2016 when he developed progressive DOE. A cardiac cath was done in 09/2015 that showed patent grafts but disease of the LCx and OM1 not previously grafted. At the appt in February he continued to have DOE not much improved since stents placed. His activity is very limited due to chronic leg weakness and pain.   Past Medical History   Past Medical History:  Diagnosis Date  . CAD (coronary artery disease)    CABG 2001; DES to Cx and OM1 09/18/15  . CKD (chronic kidney disease)   . COPD (chronic  obstructive pulmonary disease) ([place])   . Diverticulosis    patient denies  . DJD (degenerative joint disease)   . Hyperlipidemia   . Hypertension   . Internal hemorrhoids    patient denies  . Kidney stone     Past Surgical History:  Procedure Laterality Date  . ANKLE FUSION    . APPENDECTOMY    . CHOLECYSTECTOMY    . COLONOSCOPY    . CORONARY ARTERY BYPASS GRAFT  2001  . INGUINAL HERNIA REPAIR    . LEG SURGERY    . LUMBAR LAMINECTOMY     x3  . REVERSE SHOULDER ARTHROPLASTY Right 01/29/2017   Procedure: REVERSE RIGHT SHOULDER ARTHROPLASTY;  Surgeon: Netta Cedars, MD;  Location: [facility];  Service: Orthopedics;  Laterality: Right;  . ROTATOR CUFF REPAIR     x 2  . TONSILLECTOMY AND ADENOIDECTOMY    . TOTAL KNEE ARTHROPLASTY     left     Allergies  Allergies  Allergen Reactions  . Irbesartan Cough    WEAKNESS  . Nsaids Rash and Other (See Comments)    RENAL INSUFFICIENCY  . Amlodipine Swelling    SWELLING REACTION UNSPECIFIED   . Buprenorphine Hcl     UNSPECIFIED REACTION   . Diazepam     UNSPECIFIED REACTION    . Hydrocodone-Acetaminophen Rash  . Morphine And Related Rash    Inpatient Medications    . allopurinol  100 mg Oral Daily  . aspirin  81 mg Oral Daily  .  chlorthalidone  12.5 mg Oral Daily  . docusate sodium  100 mg Oral BID  . DULoxetine  60 mg Oral Daily  . feeding supplement (ENSURE ENLIVE)  237 mL Oral BID BM  . fluticasone  1 spray Each Nare Daily  . fluticasone furoate-vilanterol  1 puff Inhalation Daily  . irbesartan  75 mg Oral Daily  . metoprolol succinate  25 mg Oral QPM  . pantoprazole  40 mg Oral BID AC  . pravastatin  40 mg Oral q1800  . sucralfate  1 g Oral TID WC & HS    Family History    Family History  Problem Relation Age of Onset  . Congestive Heart Failure Father   . CAD Brother        CABG  . Colon cancer Neg Hx      Social History    Social History   Social History  . Marital status: Married    Spouse name: N/A    . Number of children: 3  . Years of education: N/A   Occupational History  . Retired [employer]   Social History Main Topics  . Smoking status: Former Research scientist (life sciences)  . Smokeless tobacco: Never Used  . Alcohol use No  . Drug use: No  . Sexual activity: Not on file   Other Topics Concern  . Not on file   Social History Narrative   Daily caffeine      Review of Systems   As per HPI above.   All other systems reviewed and are otherwise negative except as noted above.  Physical Exam    Blood pressure 131/69, pulse (!) 37, temperature 98.6 F (37 C), temperature source Oral, resp. rate 19, height 5\' 4"  (1.626 m), weight 206 lb (93.4 kg), SpO2 97 %.  General: Pleasant, NAD Psych: Normal affect. Neuro: Alert and oriented X 3. Moves all extremities spontaneously. HEENT: Normal  Neck: Supple without bruits or JVD. Lungs:  Resp regular and unlabored, CTA. Heart: regularly irregular rhythm, no s3, s4, or murmurs. Abdomen: Soft, non-tender, non-distended, BS + x 4.  Extremities: No clubbing or cyanosis. Right arm edematous with bandage to anterior right shoulder. Bilateral LE trace edema with compression stockings on. DP/PT/Radials 2+ and equal bilaterally.  Labs    Troponin (Point of Care Test) No results for input(s): TROPIPOC in the last 72 hours.  Recent Labs  02/03/17 2102 02/04/17 0301 02/04/17 0855  TROPONINI 0.06* 0.07* 0.05*   Lab Results  Component Value Date   WBC 5.9 02/04/2017   HGB 9.3 (L) 02/04/2017   HCT 29.1 (L) 02/04/2017   MCV 96.0 02/04/2017   PLT 161 02/04/2017    Recent Labs Lab 02/03/17 1945 02/04/17 0855  NA  --  134*  K  --  4.0  CL  --  100*  CO2  --  24  BUN  --  29*  CREATININE  --  1.17  CALCIUM  --  8.7*  PROT 5.5*  --   BILITOT 1.0  --   ALKPHOS 50  --   ALT 11*  --   AST 19  --   GLUCOSE  --  105*   No results found for: CHOL, HDL, LDLCALC, TRIG No results found for: [facility]   Radiology Studies    Ct Head Wo  Contrast  Result Date: 02/03/2017 CLINICAL DATA:  Altered mental status EXAM: CT HEAD WITHOUT CONTRAST TECHNIQUE: Contiguous axial images were obtained from the base of the skull through the vertex  without intravenous contrast. COMPARISON:  None. FINDINGS: Brain: No mass lesion, intraparenchymal hemorrhage or extra-axial collection. No evidence of acute cortical infarct. There is periventricular hypoattenuation compatible with chronic microvascular disease. Vascular: No hyperdense vessel or unexpected calcification. Skull: Normal visualized skull base, calvarium and extracranial soft tissues. Sinuses/Orbits: No sinus fluid levels or advanced mucosal thickening. No mastoid effusion. Normal orbits. IMPRESSION: No acute intracranial abnormality. Electronically Signed   By: Ulyses Jarred M.D.   On: 02/03/2017 22:32   Dg Chest Port 1 View  Result Date: 02/03/2017 CLINICAL DATA:  Lethargy EXAM: PORTABLE CHEST 1 VIEW COMPARISON:  12/16/2015 FINDINGS: Post sternotomy changes. Cardiomegaly with central vascular congestion. Increased opacity at the right base with probable adjacent small effusion. Aortic atherosclerosis. No pneumothorax. IMPRESSION: 1. Hazy infiltrate at the right base with small right pleural effusion. 2. Cardiomegaly with central vascular congestion. Electronically Signed   By: Donavan Foil M.D.   On: 02/03/2017 20:12   Dg Shoulder Right Port  Result Date: 01/29/2017 CLINICAL DATA:  Status post right shoulder replacement. EXAM: PORTABLE RIGHT SHOULDER COMPARISON:  Radiographs of same day. FINDINGS: The glenoid and humeral components appear to be well situated. No fracture or dislocation is noted. Expected postoperative changes are seen in the surrounding soft tissues. IMPRESSION: Status post right shoulder arthroplasty. Electronically Signed   By: Marijo Conception, M.D.   On: 01/29/2017 14:09   Dg Shoulder Right Port  Result Date: 01/29/2017 CLINICAL DATA:  Preoperative exam for shoulder  arthroplasty. EXAM: PORTABLE RIGHT SHOULDER COMPARISON:  12/16/2015 FINDINGS: Two views show advanced osteoarthritis of the glenohumeral joint with joint space narrowing and marginal osteophytes. Moderate narrowing of the humeral acromial distance. Previous distal clavicular resection. Regional ribs appear normal. IMPRESSION: Chronic osteoarthritis of the glenohumeral joint. Electronically Signed   By: Nelson Chimes M.D.   On: 01/29/2017 10:20   Dg Abd Portable 1v  Result Date: 02/01/2017 CLINICAL DATA:  Abdominal pain and distention, difficulty swallowing foods occurring the family, history hypertension, hyperlipidemia, kidney stones, COPD, chronic kidney disease, coronary artery disease, colonic diverticulosis EXAM: PORTABLE ABDOMEN - 1 VIEW COMPARISON:  CT abdomen and pelvis 10/08/2009 FINDINGS: Scattered gas throughout air-filled nondistended colon. Small bowel gas pattern normal. No bowel dilatation or bowel wall thickening. Bones appear slightly demineralized with evidence of prior lumbar fusion. No definite urinary tract calcification. IMPRESSION: Nonspecific bowel gas pattern. Electronically Signed   By: Lavonia Dana M.D.   On: 02/01/2017 17:02    EKG & Cardiac Imaging    EKG: Sinus rhythm with frequent Premature ventricular complexes in a pattern of bigeminy, Left ventricular hypertrophy with QRS widening and repolarization abnormality, 72 bpm EKG form 09/18/15 showed SB, 1st degree AVB with PAC's, LAD  Echocardiogram: Pending  Last echo per Care Everywhere done at [facility] 09/18/2015 Summary Ejection fraction is visually estimated at 50-55% The left ventricle was not well visualized, but grossly low normal LV Function. Probable mild to moderate left ventricular hypertrophy  Assessment & Plan    Elevated troponins -troponins 0.06, 0.07, 0.05, mildly elevated in flat pattern -No ischemia on EKG -Troponins checked due to pt becoming lethargic yesterday. He is better now. No chest  pain or increase in his usual DOE.  -Troponins likely elevated in setting of recent surgery and CKD. This is not ACS. With no chest pain, would not pursue any further cardiac evaluation  CAD -S/p CABG 2001, s/p stenting LCx and OM1 09/2015 with patent grafts -Treated with aspirin, Toprol 25 mg dialy, statin and  ARB  PVC's -Frequent PVC's with consistent bigeminy and trigeminy with rates in the 70's -normal electrolytes. Mag 1.7, 1 gm IV magnesium given. Pt unaware of previously diagnosed PVC's., Old EKG's indicate PAC's  Hypertension -Treated with valsartan 80 mg daily, toprol and chlorthalidone 12.5 mg daily  CKD stage 3 -SCr has improved, max of 2.2 to 1.17 today  Chronic dyspnea on exertion -likely related to COPD as did not improve much after stenting in 09/2015  Hyperlipidemia -Last lipid panel 10/28/16: LDL 71 -Continue statin  SignedDaune Perch, NP-C 02/04/2017, 3:25 PM Pager: [phone]  History and all data above reviewed.  Patient examined. The patient had AMS yesterday and was difficult to arouse.  However, he has not had any acute cardiac complaints.   He does have CAD as described.  Troponin has been slightly elevated but the trend is flat.  He is awake and alert and doing well today.  He has no acute ST changes on EKG.  He does have frequent ventricular ectopy.  He does not feel this.  He denies any cardiovascular symptoms like SOB, palpitations, chest pain.   I agree with the findings as above.  The patient exam reveals [id number] with ectopy  ,  Lungs: Clear  ,  Abd: Positive bowel sounds, no rebound no guarding, Ext Mild leg edema and right greater than left arm edema  .  All available labs, radiology testing, previous records reviewed. Agree with documented assessment and plan. ELEVATED TROPONIN:  This is nonspecific.  He has no symptoms of active coronary ischemia.  No further work up is indicated.  We will continue meds as listed.  PVCs:  His heart rate is  undercounted because they are not counting the PVCs.  He does not feel these.  No change in therapy is indicated.   Jeneen Rinks Tyshay Adee  5:28 PM  02/04/2017

## 2017-02-05 ENCOUNTER — Other Ambulatory Visit: Payer: Self-pay | Admitting: Emergency Medicine

## 2017-02-05 ENCOUNTER — Telehealth (HOSPITAL_COMMUNITY): Payer: Self-pay | Admitting: Emergency Medicine

## 2017-02-05 DIAGNOSIS — I493 Ventricular premature depolarization: Secondary | ICD-10-CM

## 2017-02-05 DIAGNOSIS — R7989 Other specified abnormal findings of blood chemistry: Principal | ICD-10-CM

## 2017-02-05 DIAGNOSIS — R778 Other specified abnormalities of plasma proteins: Secondary | ICD-10-CM

## 2017-02-05 DIAGNOSIS — I10 Essential (primary) hypertension: Secondary | ICD-10-CM

## 2017-02-05 DIAGNOSIS — I248 Other forms of acute ischemic heart disease: Secondary | ICD-10-CM

## 2017-02-05 DIAGNOSIS — E7800 Pure hypercholesterolemia, unspecified: Secondary | ICD-10-CM

## 2017-02-05 LAB — GLUCOSE, CAPILLARY
GLUCOSE-CAPILLARY: 118 mg/dL — AB (ref 65–99)
Glucose-Capillary: 109 mg/dL — ABNORMAL HIGH (ref 65–99)
Glucose-Capillary: 114 mg/dL — ABNORMAL HIGH (ref 65–99)
Glucose-Capillary: 148 mg/dL — ABNORMAL HIGH (ref 65–99)

## 2017-02-05 LAB — HEMOGLOBIN AND HEMATOCRIT, BLOOD
HEMATOCRIT: 30.9 % — AB (ref 39.0–52.0)
HEMOGLOBIN: 10 g/dL — AB (ref 13.0–17.0)

## 2017-02-05 MED ORDER — METOPROLOL SUCCINATE ER 50 MG PO TB24
50.0000 mg | ORAL_TABLET | Freq: Every evening | ORAL | Status: DC
  Administered 2017-02-05: 50 mg via ORAL
  Filled 2017-02-05: qty 1

## 2017-02-05 NOTE — Discharge Instructions (Signed)
Keep the incision clean and dry and covered for one week, then ok to get the incision wet in the shower  Prop a pillow or blanket behind the right elbow to keep the arm across your waist  Ok to remove the sling and use the arm for light daily activity. Ok to weight bear with a walker and use for balance  Follow up in ten days in the office [phone]    You have a Stress Test scheduled at [facility]. Your doctor has ordered this test to check the blood flow in your heart arteries.  Please arrive 15 minutes early for paperwork. The whole test will take several hours. You may want to bring reading material to remain occupied while undergoing different parts of the test.  Instructions:  No food/drink after midnight the night before.  It is OK to take your morning meds with a sip of water EXCEPT for those types of medicines listed below or otherwise instructed.  No caffeine/decaf products 24 hours before, including medicines such as Excedrin or Goody Powders. Call if there are any questions.   Wear comfortable clothes and shoes.   Special Medication Instructions:  Beta blockers such as metoprolol (Lopressor/Toprol XL), atenolol (Tenormin), carvedilol (Coreg), nebivolol (Bystolic), bisoprolol (Zebeta), propranolol (Inderal) should not be taken for 24 hours before the test.  Calcium channel blockers such as diltiazem (Cardizem) or verapmil (Calan) should not be taken for 24 hours before the test.  Remove nitroglycerin patches and do not take nitrate preparations such as Imdur/isosorbide the day of your test.  No Persantine/Theophylline or Aggrenox medicines should be used within 24 hours of the test.   If you are diabetic, please ask which medications to hold the day of the test.  What To Expect: When you arrive in the lab, the technician will inject a small amount of radioactive tracer into your arm through an IV while you are resting quietly. This helps [place]  to form pictures of your heart. You will likely only feel a sting from the IV. After a waiting period, resting pictures will be obtained under a big camera. These are the "before" pictures.  Next, you will be prepped for the stress portion of the test. This may include either walking on a treadmill or receiving a medicine that helps to dilate blood vessels in your heart to simulate the effect of exercise on your heart. If you are walking on a treadmill, you will walk at different paces to try to get your heart rate to a goal number that is based on your age. If your doctor has chosen the pharmacologic test, then you will receive a medicine through your IV that may cause temporary nausea, flushing, shortness of breath and sometimes chest discomfort or vomiting. This is typically short-lived and usually resolves quickly. If you experience symptoms, that does not automatically mean the test is abnormal. Some patients do not experience any symptoms at all. Your blood pressure and heart rate will be monitored, and we will be watching your EKG on a computer screen for any changes. During this portion of the test, the radiologist will inject another small amount of radioactive tracer into your IV. After a waiting period, you will undergo a second set of pictures. These are the "after" pictures.  The doctor reading the test will compare the before-and-after images to look for evidence of heart blockages or heart weakness. The test usually takes 1 day to complete, but in certain instances (for  example, if a patient is over a certain weight limit), the test may be done over the span of 2 days.

## 2017-02-05 NOTE — Progress Notes (Signed)
Progress Note  Patient Name: Kristopher Ayers Date of Encounter: 02/05/2017  Primary Cardiologist: Dr. Martinique  Subjective   Feeling well.  Denies chest pain, shortness of breath or palpitations.   Inpatient Medications    Scheduled Meds: . allopurinol  100 mg Oral Daily  . aspirin  81 mg Oral Daily  . chlorthalidone  12.5 mg Oral Daily  . docusate sodium  100 mg Oral BID  . DULoxetine  60 mg Oral Daily  . feeding supplement (ENSURE ENLIVE)  237 mL Oral BID BM  . fluticasone  1 spray Each Nare Daily  . fluticasone furoate-vilanterol  1 puff Inhalation Daily  . irbesartan  75 mg Oral Daily  . metoprolol succinate  25 mg Oral QPM  . pantoprazole  40 mg Oral BID AC  . pravastatin  40 mg Oral q1800  . sucralfate  1 g Oral TID WC & HS   Continuous Infusions: . lactated ringers 10 mL/hr at 01/29/17 0841   PRN Meds: acetaminophen **OR** acetaminophen, ipratropium, menthol-cetylpyridinium **OR** phenol, metoCLOPramide **OR** metoCLOPramide (REGLAN) injection, ondansetron **OR** ondansetron (ZOFRAN) IV, polyethylene glycol, traMADol   Vital Signs    Vitals:   02/05/17 0333 02/05/17 0400 02/05/17 0859 02/05/17 1245  BP: (!) 153/48  (!) 153/71 (!) 160/55  Pulse: (!) 32 (!) 32 (!) 35 (!) 37  Resp: 14 (!) 24 (!) 22 15  Temp: 98.6 F (37 C)  98.3 F (36.8 C) 98.3 F (36.8 C)  TempSrc: Oral  Oral Oral  SpO2: 98% 94% 99% 99%  Weight:      Height:        Intake/Output Summary (Last 24 hours) at 02/05/17 1432 Last data filed at 02/05/17 0600  Gross per 24 hour  Intake              240 ml  Output             1925 ml  Net            -1685 ml   Filed Weights   01/29/17 0832  Weight: 93.4 kg (206 lb)    Telemetry    Sinus rhythm with frequent PVCs and ventricular bigeminy.  - Personally Reviewed  ECG    N/a  - Personally Reviewed  Physical Exam   GEN: No acute distress.   Neck: No JVD Cardiac: RRR, no murmurs, rubs, or gallops.  Respiratory: Clear to  auscultation bilaterally. GI: Soft, nontender, non-distended  MS: No edema; No deformity. Neuro:  Nonfocal  Psych: Normal affect  Skin: R UE ecchymosis and edema  Labs    Chemistry Recent Labs Lab 02/03/17 0527 02/03/17 1916 02/03/17 1945 02/04/17 0855  NA 134* 132*  --  134*  K 4.0 4.1  --  4.0  CL 102 100*  --  100*  CO2 24 25  --  24  GLUCOSE 107* 119*  --  105*  BUN 32* 35*  --  29*  CREATININE 1.43* 1.31*  --  1.17  CALCIUM 8.4* 8.6*  --  8.7*  PROT  --   --  5.5*  --   ALBUMIN  --   --  2.7*  --   AST  --   --  19  --   ALT  --   --  11*  --   ALKPHOS  --   --  50  --   BILITOT  --   --  1.0  --   GFRNONAA 42* 47*  --  [place]  >60  ANIONGAP 8 7  --  10     Hematology Recent Labs Lab 02/03/17 0527 02/03/17 1916 02/04/17 0855 02/05/17 1312  WBC 6.5 5.9 5.9  --   RBC 2.98* 3.30* 3.03*  --   HGB 9.2* 10.1* 9.3* 10.0*  HCT 29.0* 31.6* 29.1* 30.9*  MCV 97.3 95.8 96.0  --   MCH 30.9 30.6 30.7  --   MCHC 31.7 32.0 32.0  --   RDW 13.4 13.4 13.4  --   PLT 150 159 161  --     Cardiac Enzymes Recent Labs Lab 02/03/17 2102 02/04/17 0301 02/04/17 0855  TROPONINI 0.06* 0.07* 0.05*   No results for input(s): TROPIPOC in the last 168 hours.   BNPNo results for input(s): BNP, PROBNP in the last 168 hours.   DDimer No results for input(s): DDIMER in the last 168 hours.   Radiology    Ct Head Wo Contrast  Result Date: 02/03/2017 CLINICAL DATA:  Altered mental status EXAM: CT HEAD WITHOUT CONTRAST TECHNIQUE: Contiguous axial images were obtained from the base of the skull through the vertex without intravenous contrast. COMPARISON:  None. FINDINGS: Brain: No mass lesion, intraparenchymal hemorrhage or extra-axial collection. No evidence of acute cortical infarct. There is periventricular hypoattenuation compatible with chronic microvascular disease. Vascular: No hyperdense vessel or unexpected calcification. Skull: Normal visualized skull base,  calvarium and extracranial soft tissues. Sinuses/Orbits: No sinus fluid levels or advanced mucosal thickening. No mastoid effusion. Normal orbits. IMPRESSION: No acute intracranial abnormality. Electronically Signed   By: Ulyses Jarred M.D.   On: 02/03/2017 22:32   Dg Chest Port 1 View  Result Date: 02/03/2017 CLINICAL DATA:  Lethargy EXAM: PORTABLE CHEST 1 VIEW COMPARISON:  12/16/2015 FINDINGS: Post sternotomy changes. Cardiomegaly with central vascular congestion. Increased opacity at the right base with probable adjacent small effusion. Aortic atherosclerosis. No pneumothorax. IMPRESSION: 1. Hazy infiltrate at the right base with small right pleural effusion. 2. Cardiomegaly with central vascular congestion. Electronically Signed   By: Donavan Foil M.D.   On: 02/03/2017 20:12    Cardiac Studies   Last echo per Care Everywhere done at [facility] 09/18/2015 Summary Ejection fraction is visually estimated at 50-55% The left ventricle was not well visualized, but grossly low normal LV Function. Probable mild to moderate left ventricular hypertrophy   Patient Profile     83 y.o. male with CAD s/p CABG in 2001, PCI tyo LCx and OM1 in 2016, hypertension, COPD, hyperlipidemia and CKD III here with R shoulder surgery.  Noted to have PVCs and mildly elevated troponin post-op.   Assessment & Plan    # CAD s/p CABG and PCI:  # PVCs: # Ventricular bigeminy: # Demand ischemia Mr. Doverspike has asymptomatic PVCs that are quite frequent. We will increase metoprolol to 50mg  daily.  He will need an outpatient stress and echo.  Continue aspirin and pravastatin.  He will need fasting lipids.  This can be done as an outpatient if he is being discharged.    # Hypertension: Blood pressure is above goal. Continue chlorthalidone, irbesartan and increase metoprolol as above.    Signed, Skeet Latch, MD  02/05/2017, 2:32 PM

## 2017-02-05 NOTE — Progress Notes (Signed)
Orthopedics Progress Note  Subjective: Patient feeling better this PM hoping for discharge tomorrow morning to SNF  Objective:  Vitals:   02/05/17 1245 02/05/17 1630  BP: (!) 160/55 (!) 146/87  Pulse: (!) 37   Resp: 15 20  Temp: 98.3 F (36.8 C) 97.7 F (36.5 C)    General: Awake and alert  Musculoskeletal: right shoulder incision clean and dry with Aquacel in place, moderate edema, no pain with AROM Neurovascularly intact  Lab Results  Component Value Date   WBC 5.9 02/04/2017   HGB 10.0 (L) 02/05/2017   HCT 30.9 (L) 02/05/2017   MCV 96.0 02/04/2017   PLT 161 02/04/2017       Component Value Date/Time   NA 134 (L) 02/04/2017 0855   K 4.0 02/04/2017 0855   CL 100 (L) 02/04/2017 0855   CO2 24 02/04/2017 0855   GLUCOSE 105 (H) 02/04/2017 0855   BUN 29 (H) 02/04/2017 0855   CREATININE 1.17 02/04/2017 0855   CALCIUM 8.7 (L) 02/04/2017 0855   GFRNONAA 54 (L) 02/04/2017 0855   GFRAA >60 02/04/2017 0855    No results found for: INR, PROTIME  Assessment/Plan: POD #7 s/p Procedure(s): REVERSE RIGHT SHOULDER ARTHROPLASTY H/H shows that his anemia has stabilized, cardiology has adjusted metoprolol and recommended outpatient studies  patient is now ready for discharge which is planned for tomorrow morning due to late hour. Continue PT, OT and supportive care   Doran Heater. Veverly Fells, MD 02/05/2017 5:09 PM

## 2017-02-05 NOTE — Progress Notes (Signed)
Occupational Therapy Treatment Patient Details Name: Kristopher Ayers MRN: [id number] DOB: 1925/12/04 Today's Date: 02/05/2017    History of present illness Pt is an 83 y.o. male s/p R reverse TSA. PMHx: CAD, COPD, HTN, Ankle fusion, CABG, Lumbar laminectomy x3, Rotator cuff repair x2, L TKA.02/03/17 pt transferred to SDU due to somulence.   OT comments  This 83 yo male admitted for above presents to acute OT showing good movement of RUE but not able to complete exercises on his own or with S, still needs A. He will continue to benefit from acute OT with follow up at SNF to get back to PLOF.  Follow Up Recommendations  SNF;Supervision/Assistance - 24 hour    Equipment Recommendations  3 in 1 bedside commode       Precautions / Restrictions Precautions Precautions: Fall;Shoulder Type of Shoulder Precautions: Active protocol: AROM elbow/wrist/hand OK. A/PROM shoulder: FF 90, ABD 60, ER 30. Shoulder Interventions: Shoulder sling/immobilizer;For comfort Precaution Comments: Reviewed precautions with pt Required Braces or Orthoses: Sling Restrictions Weight Bearing Restrictions: Yes RUE Weight Bearing: Weight bearing as tolerated Other Position/Activity Restrictions: WBAT for use with walker       Mobility Bed Mobility               General bed mobility comments: Up in chair upon OT arrival.          ADL either performed or assessed with clinical judgement                  Cognition Arousal/Alertness: Awake/alert Behavior During Therapy: WFL for tasks assessed/performed Overall Cognitive Status: Within Functional Limits for tasks assessed                                 General Comments: Pt very fatigued and had difficulty staying awake        Exercises Other Exercises Other Exercises: Pt able to perform with AAROM shoulder flexion (to 60 degrees); shoulder abduction (to 60 degrees), shoulder external rotation (to neutral). Pt also performed with  AAROM elbow flexion/extension and then AROM for lap slides, forearm supination/pronation, and composite flexion/extension of digits. All of these while seated in recliner           Pertinent Vitals/ Pain       Pain Assessment: Faces Faces Pain Scale: Hurts little more Pain Location: R shoulder Pain Descriptors / Indicators: Aching;Sore Pain Intervention(s): Monitored during session;Repositioned     Prior Functioning/Environment              Frequency  Min 3X/week        Progress Toward Goals  OT Goals(current goals can now be found in the care plan section)  Progress towards OT goals: Not progressing toward goals - comment (pt still needing A for some exercises and S for others)  Acute Rehab OT Goals Patient Stated Goal: return home  Plan Discharge plan remains appropriate       AM-PAC PT "6 Clicks" Daily Activity     Outcome Measure   Help from another person eating meals?: A Little Help from another person taking care of personal grooming?: A Lot Help from another person toileting, which includes using toliet, bedpan, or urinal?: A Lot Help from another person bathing (including washing, rinsing, drying)?: A Lot Help from another person to put on and taking off regular upper body clothing?: A Lot Help from another person to put on and taking off  regular lower body clothing?: A Lot 6 Click Score: 13    End of Session    OT Visit Diagnosis: Unsteadiness on feet (R26.81);Other abnormalities of gait and mobility (R26.89);Muscle weakness (generalized) (M62.81);Pain Pain - Right/Left: Right Pain - part of body: Shoulder   Activity Tolerance Patient tolerated treatment well   Patient Left in chair;with call bell/phone within reach   Nurse Communication          Time: 2820-8138 OT Time Calculation (min): 8 min  Charges: OT General Charges $OT Visit: 1 Procedure OT Treatments $Therapeutic Exercise: 8-22 mins  Jonh Mcqueary,  OTR/L [phone] 02/05/2017

## 2017-02-05 NOTE — Clinical Social Work Note (Signed)
CSW, RN, MD Veverly Fells, and family met in pt's room. Per MD Veverly Fells pt to discharge tomorrow. Pt and family agreed to [facility] and are aware of out-of-network expense. CSW notified Stanton Kidney at [facility] of Saturday admission and she is able to accommodate. CSW continuing to follow for family support and  discharge needs.   Kristopher Ayers, [place], [place] Work [phone]

## 2017-02-05 NOTE — Progress Notes (Signed)
Physical Therapy Treatment Patient Details Name: Kristopher Ayers MRN: [id number] DOB: 10-26-1925 Today's Date: 02/05/2017    History of Present Illness Pt is an 82 y.o. male s/p R reverse TSA. PMHx: CAD, COPD, HTN, Ankle fusion, CABG, Lumbar laminectomy x3, Rotator cuff repair x2, L TKA.    PT Comments    Emphasis on shoulder, elbow, wrist and hand AAROM, gait stability and stamina.  Pt improving steadily.   Follow Up Recommendations  SNF;Supervision/Assistance - 24 hour     Equipment Recommendations  None recommended by PT    Recommendations for Other Services       Precautions / Restrictions Precautions Precautions: Fall;Shoulder Type of Shoulder Precautions: Active protocol: AROM elbow/wrist/hand OK. A/PROM shoulder: FF 90, ABD 60, ER 30. Shoulder Interventions: Shoulder sling/immobilizer;For comfort Precaution Comments: Reviewed precautions with pt Required Braces or Orthoses: Sling Restrictions Weight Bearing Restrictions: Yes RUE Weight Bearing: Weight bearing as tolerated Other Position/Activity Restrictions: WBAT for use with walker    Mobility  Bed Mobility               General bed mobility comments: Up in chair upon PT arrival.   Transfers Overall transfer level: Needs assistance Equipment used: Rolling walker (2 wheeled) Transfers: Sit to/from Stand Sit to Stand: Min assist;Mod assist         General transfer comment: cues for hand placement, assist to help forward and to boost.  Ambulation/Gait Ambulation/Gait assistance: Min assist Ambulation Distance (Feet): 90 Feet (and additional 80 feet ) Assistive device: Rolling walker (2 wheeled) Gait Pattern/deviations: Step-through pattern Gait velocity: slower Gait velocity interpretation: Below normal speed for age/gender General Gait Details: pt with slow gait, weak-kneed gait and increasing more fatigued with distance traveled.   Stairs            Wheelchair Mobility    Modified  Rankin (Stroke Patients Only)       Balance Overall balance assessment: Needs assistance   Sitting balance-Leahy Scale: Good     Standing balance support: During functional activity;Bilateral upper extremity supported Standing balance-Leahy Scale: Poor Standing balance comment: Reliant on BUEs for support in standing.                            Cognition Arousal/Alertness: Awake/alert Behavior During Therapy: WFL for tasks assessed/performed Overall Cognitive Status: Within Functional Limits for tasks assessed                                 General Comments: Pt very fatigued and had difficulty staying awake      Exercises Other Exercises Other Exercises: Pt able to perform with AAROM shoulder flexion (to 60 degrees); shoulder abduction (to 60 degrees), shoulder external rotation (to neutral). Pt also performed with AAROM elbow flexion/extension and then AROM for lap slides, forearm supination/pronation, and composite flexion/extension of digits. All of these while seated in recliner    General Comments        Pertinent Vitals/Pain Pain Assessment: Faces Faces Pain Scale: Hurts little more Pain Location: R shoulder Pain Descriptors / Indicators: Aching;Sore Pain Intervention(s): Monitored during session;Repositioned    Home Living                      Prior Function            PT Goals (current goals can now be found in the care plan  section) Acute Rehab PT Goals Patient Stated Goal: return home PT Goal Formulation: With patient Time For Goal Achievement: 02/13/17 Potential to Achieve Goals: Fair Progress towards PT goals: Progressing toward goals    Frequency    Min 3X/week      PT Plan Current plan remains appropriate    Co-evaluation              AM-PAC PT "6 Clicks" Daily Activity  Outcome Measure  Difficulty turning over in bed (including adjusting bedclothes, sheets and blankets)?: Total Difficulty  moving from lying on back to sitting on the side of the bed? : Total Difficulty sitting down on and standing up from a chair with arms (e.g., wheelchair, bedside commode, etc,.)?: Total Help needed moving to and from a bed to chair (including a wheelchair)?: A Little Help needed walking in hospital room?: A Little Help needed climbing 3-5 steps with a railing? : A Lot 6 Click Score: 11    End of Session   Activity Tolerance: Patient tolerated treatment well Patient left: in chair;with call bell/phone within reach;with family/visitor present Nurse Communication: Mobility status PT Visit Diagnosis: Unsteadiness on feet (R26.81);Other abnormalities of gait and mobility (R26.89);Muscle weakness (generalized) (M62.81);Pain Pain - Right/Left: Right Pain - part of body: Shoulder     Time: 1425-1450 PT Time Calculation (min) (ACUTE ONLY): 25 min  Charges:  $Gait Training: 8-22 mins $Therapeutic Activity: 8-22 mins                    G Codes:       2017-02-15  Donnella Sham, PT [phone]  (pager)   Tessie Fass Whittaker Lenis 02/15/2017, 5:08 PM

## 2017-02-05 NOTE — Progress Notes (Signed)
Orthopedics Progress Note  Subjective: Patient feels better this morning. Denies any pain  Objective:  Vitals:   02/05/17 0333 02/05/17 0400  BP: (!) 153/48   Pulse: (!) 32 (!) 32  Resp: 14 (!) 24  Temp: 98.6 F (37 C)     General: Awake and alert  Musculoskeletal: Right arm with moderate edema but no tenderness and no drainage from wound, good AROM at the elbow and wrist Neurovascularly intact  Lab Results  Component Value Date   WBC 5.9 02/04/2017   HGB 9.3 (L) 02/04/2017   HCT 29.1 (L) 02/04/2017   MCV 96.0 02/04/2017   PLT 161 02/04/2017       Component Value Date/Time   NA 134 (L) 02/04/2017 0855   K 4.0 02/04/2017 0855   CL 100 (L) 02/04/2017 0855   CO2 24 02/04/2017 0855   GLUCOSE 105 (H) 02/04/2017 0855   BUN 29 (H) 02/04/2017 0855   CREATININE 1.17 02/04/2017 0855   CALCIUM 8.7 (L) 02/04/2017 0855   GFRNONAA 54 (L) 02/04/2017 0855   GFRAA >60 02/04/2017 0855    No results found for: INR, PROTIME  Assessment/Plan: POD #7 s/p Procedure(s): REVERSE RIGHT SHOULDER ARTHROPLASTY Patient stable medically. Plan discharge to SNF today for short term rehab stint before heading home with home health Mobilization, PT,OT Appreciated cardiology and internal medicine support  Doran Heater. Veverly Fells, MD 02/05/2017 7:42 AM

## 2017-02-05 NOTE — Progress Notes (Signed)
CONSULT PROGRESS NOTE    Kristopher Ayers  EYC:[id number] DOB: 07-01-1926 DOA: 01/29/2017 PCP: Kristopher Coma., MD    Brief Narrative: Kristopher Ayers is an 83 year old male with past medical history of hypertension, hyperlipidemia, osteoarthritis, COPD, Stage III Chronic Kidney Disease and CaD s/p CABG. [facility] has been following the patient for medical management of renal function, lethargy and intermittent dysphagia with epigastric pain. The patient had episode of lethargy on the evening of 02/03/2017.  Assessment & Plan:  # Lethargy and change in mental status likely acute delirium: -UA negative for UTI, CT head with no acute finding. CBC and BMP stable. -Ammonia level acceptable. -Chest x-ray with possible atelectasis -EKG with PVCs. -The patient's mental status significantly improved and he is medically stable to transfer his care to outpatient.  # Elevated troponin level: Evaluated by cardiologist recommended no further intervention. Patient has no chest pain.  -I reviewed the echocardiogram which was done on January 2017 at outside hospital which showed EF of 50-55% with normal LV function.  #PVCs/bigeminy, bradycardia:  -The patient was evaluated by cardiologist on 6/2, recommended to continue metoprolol. As per cardiologist "BP cuff will not measure the PVC beat and will display bradycardia in the setting of Bigeminy." -Continue metoprolol. -TSH acceptable.  # Right shoulder joint osteoarthritis S/P elective shoulder arthroplasty 01/29/2017: Post op day 6. Per primary team. Doppler was negative for DVT.  # Abdominal pain: Symptoms most likely due to GERD, have resolved. Continue PPI and Carafate.   # Acute on Chronic Kidney Disease, stage III: Creatinine 1.17. Monitor BMP. Avoid nephrotoxins.   # Hypertension: BP returning to baseline. Continue home regimen of chlorthalidone, Avapro and metoprolol.    #Dysphagia: Patient tolerating diet and symptoms improving.  Outpatient swallow evaluation suggested if he continues to have symptoms.  # CAD (coronary artery disease): Asymptomatic. Continue ASA and Beta blocker.  # COPD: Asymptomatic, breathing without difficulty. Continue nebulizer and Breo inhaler.  Patient is clinically improved. He was alert awake and oriented this morning. He was eating his breakfast by himself. He denied chest pain, shortness of breath, nausea or vomiting. Patient is medically stable to transfer his care to outpatient. I recommended patient to follow-up with his PCP in 1 week. At this time, I will sign off. Please call us back if you have any question.  DVT prophylaxis: per surgery team. Code Status: full Family Communication: none at bedside Disposition Plan: May discharge to a skilled facility after an echo and cardiology evaluation.   Procedures:   Right shoulder reverse total arthroplasty per Dr. Veverly Fells 01/29/2017  Abdominal XR 02/01/2017  Right Upper Extremity Doppler U.S. 02/03/2017  Antimicrobials:   None  Subjective: Patient was seen and examined at bedside. He reported feeling good. He was alert awake and oriented. Denied headache, dizziness, nausea, vomiting, chest or shortness of breath. Objective: Vitals:   02/04/17 2357 02/05/17 0333 02/05/17 0400 02/05/17 0859  BP: (!) 151/54 (!) 153/48  (!) 153/71  Pulse: (!) 33 (!) 32 (!) 32 (!) 35  Resp: (!) 22 14 (!) 24 (!) 22  Temp: 98.4 F (36.9 C) 98.6 F (37 C)  98.3 F (36.8 C)  TempSrc:  Oral  Oral  SpO2: 92% 98% 94% 99%  Weight:      Height:        Intake/Output Summary (Last 24 hours) at 02/05/17 1143 Last data filed at 02/05/17 0600  Gross per 24 hour  Intake  480 ml  Output             2225 ml  Net            -1745 ml   Filed Weights   01/29/17 0832  Weight: 93.4 kg (206 lb)    Examination:  General exam: Elderly male sitting on bed comfortable, not in distress  Respiratory system: Clear bilateral. Respiratory effort  normal. Cardiovascular system: Regular rate rhythm, S1 is normal. No pedal edema. Gastrointestinal system: Abdomen soft, nontender, nondistended. Bowel sound positive Central nervous system: Alert awake and oriented to hospital, name and June 2018. Skin: No rashes, lesions or ulcers Psychiatry:  Mood & affect is AGE appropriate.    Data Reviewed: I have personally reviewed following labs and imaging studies  CBC:  Recent Labs Lab 01/29/17 1744 01/30/17 0634 02/02/17 0818 02/03/17 0527 02/03/17 1916 02/04/17 0855  WBC 9.9  --  6.1 6.5 5.9 5.9  NEUTROABS  --   --  4.0  --   --   --   HGB 12.0* 11.7* 9.6* 9.2* 10.1* 9.3*  HCT 37.5* 37.0* 30.3* 29.0* 31.6* 29.1*  MCV 97.9  --  97.4 97.3 95.8 96.0  PLT 144*  --  141* 150 159 637   Basic Metabolic Panel:  Recent Labs Lab 01/29/17 1744  01/31/17 0515 02/02/17 0818 02/03/17 0527 02/03/17 1916 02/03/17 1945 02/04/17 0855  NA 139  < > 136 137 134* 132*  --  134*  K 5.0  < > 4.2 4.5 4.0 4.1  --  4.0  CL 108  < > 102 105 102 100*  --  100*  CO2 23  < > 24 24 24 25   --  24  GLUCOSE 130*  < > 123* 126* 107* 119*  --  105*  BUN 36*  < > 42* 33* 32* 35*  --  29*  CREATININE 1.45*  < > 2.20* 1.31* 1.43* 1.31*  --  1.17  CALCIUM 8.6*  < > 8.0* 8.4* 8.4* 8.6*  --  8.7*  MG 1.7  --   --   --   --   --  1.7  --   < > = values in this interval not displayed. GFR: Estimated Creatinine Clearance: 45 mL/min (by C-G formula based on SCr of 1.17 mg/dL). Liver Function Tests:  Recent Labs Lab 02/03/17 1945  AST 19  ALT 11*  ALKPHOS 50  BILITOT 1.0  PROT 5.5*  ALBUMIN 2.7*   No results for input(s): LIPASE, AMYLASE in the last 168 hours.  Recent Labs Lab 02/03/17 1950  AMMONIA 20   Coagulation Profile: No results for input(s): INR, PROTIME in the last 168 hours. Cardiac Enzymes:  Recent Labs Lab 02/03/17 2102 02/04/17 0301 02/04/17 0855  TROPONINI 0.06* 0.07* 0.05*   BNP (last 3 results) No results for input(s):  PROBNP in the last 8760 hours. HbA1C: No results for input(s): HGBA1C in the last 72 hours. CBG:  Recent Labs Lab 02/04/17 0923 02/04/17 1246 02/04/17 1641 02/04/17 2057 02/05/17 0857  GLUCAP 95 160* 112* 110* 109*   Lipid Profile: No results for input(s): CHOL, HDL, LDLCALC, TRIG, CHOLHDL, LDLDIRECT in the last 72 hours. Thyroid Function Tests: No results for input(s): TSH, T4TOTAL, FREET4, T3FREE, THYROIDAB in the last 72 hours. Anemia Panel: No results for input(s): VITAMINB12, FOLATE, FERRITIN, TIBC, IRON, RETICCTPCT in the last 72 hours. Sepsis Labs:  Recent Labs Lab 02/03/17 1950  LATICACIDVEN 0.9    No results found for  this or any previous visit (from the past 240 hour(s)).       Radiology Studies: Ct Head Wo Contrast  Result Date: 02/03/2017 CLINICAL DATA:  Altered mental status EXAM: CT HEAD WITHOUT CONTRAST TECHNIQUE: Contiguous axial images were obtained from the base of the skull through the vertex without intravenous contrast. COMPARISON:  None. FINDINGS: Brain: No mass lesion, intraparenchymal hemorrhage or extra-axial collection. No evidence of acute cortical infarct. There is periventricular hypoattenuation compatible with chronic microvascular disease. Vascular: No hyperdense vessel or unexpected calcification. Skull: Normal visualized skull base, calvarium and extracranial soft tissues. Sinuses/Orbits: No sinus fluid levels or advanced mucosal thickening. No mastoid effusion. Normal orbits. IMPRESSION: No acute intracranial abnormality. Electronically Signed   By: Ulyses Jarred M.D.   On: 02/03/2017 22:32   Dg Chest Port 1 View  Result Date: 02/03/2017 CLINICAL DATA:  Lethargy EXAM: PORTABLE CHEST 1 VIEW COMPARISON:  12/16/2015 FINDINGS: Post sternotomy changes. Cardiomegaly with central vascular congestion. Increased opacity at the right base with probable adjacent small effusion. Aortic atherosclerosis. No pneumothorax. IMPRESSION: 1. Hazy infiltrate at the  right base with small right pleural effusion. 2. Cardiomegaly with central vascular congestion. Electronically Signed   By: Donavan Foil M.D.   On: 02/03/2017 20:12        Scheduled Meds: . allopurinol  100 mg Oral Daily  . aspirin  81 mg Oral Daily  . chlorthalidone  12.5 mg Oral Daily  . docusate sodium  100 mg Oral BID  . DULoxetine  60 mg Oral Daily  . feeding supplement (ENSURE ENLIVE)  237 mL Oral BID BM  . fluticasone  1 spray Each Nare Daily  . fluticasone furoate-vilanterol  1 puff Inhalation Daily  . irbesartan  75 mg Oral Daily  . metoprolol succinate  25 mg Oral QPM  . pantoprazole  40 mg Oral BID AC  . pravastatin  40 mg Oral q1800  . sucralfate  1 g Oral TID WC & HS   Continuous Infusions: . lactated ringers 10 mL/hr at 01/29/17 0841     LOS: 7 days    Time spent: 25 minutes    Colston Pyle Tanna Furry, PA-S [facility] Pager [phone]  If 7PM-7AM, please contact night-coverage [email or web address] Password [facility] 02/05/2017, 11:43 AM

## 2017-02-06 LAB — GLUCOSE, CAPILLARY: Glucose-Capillary: 143 mg/dL — ABNORMAL HIGH (ref 65–99)

## 2017-02-06 NOTE — NC FL2 (Signed)
[place] LEVEL OF CARE SCREENING TOOL     IDENTIFICATION  Patient Name: SULEMAN GUNNING Birthdate: [birth year] Sex: male Admission Date (Current Location): 01/29/2017  [facility] and [place] Number:  Herbalist and Address:  [facility] [place], [place] [address] [place], [place] [address]      Provider Number: [id number]  Attending Physician Name and Address:  Netta Cedars, MD  Relative Name and Phone Number:       Current Level of Care: Hospital Recommended Level of Care: [place] Prior Approval Number:    Date Approved/Denied: 02/01/17 PASRR Number: [id number]  Discharge Plan: SNF    Current Diagnoses: Patient Active Problem List   Diagnosis Date Noted  . Demand ischemia ([facility] [place])   . PVC's (premature ventricular contractions)   . Acute delirium   . Troponin level elevated   . Arthritis of shoulder region, right, degenerative   . Abdominal pain 02/01/2017  . Acute kidney injury superimposed on CKD ([place]) 02/01/2017  . Epigastric abdominal pain 02/01/2017  . Hyperglycemia 02/01/2017  . Bradycardia 02/01/2017  . Essential hypertension 02/01/2017  . CKD (chronic kidney disease), stage III 02/01/2017  . S/P shoulder replacement, right 01/29/2017  . Hypertension   . Hyperlipidemia   . CAD (coronary artery disease)     Orientation RESPIRATION BLADDER Height & Weight     Self, Time, Situation, Place  Normal Continent Weight: 206 lb (93.4 kg) Height:  5\' 4"  (162.6 cm)  BEHAVIORAL SYMPTOMS/MOOD NEUROLOGICAL BOWEL NUTRITION STATUS      Continent Diet (See dc Summary)  AMBULATORY STATUS COMMUNICATION OF NEEDS Skin   Extensive Assist Verbally Surgical wounds (Closed Right Shoulder Incision, with ABD dressing)                       Personal Care Assistance Level of Assistance  Bathing, Feeding, Dressing Bathing Assistance: Maximum assistance Feeding assistance: Limited assistance Dressing Assistance: Maximum  assistance     Functional Limitations Info  Sight, Hearing, Speech Sight Info: Adequate Hearing Info: Impaired Speech Info: Adequate    SPECIAL CARE FACTORS FREQUENCY  PT (By licensed PT), OT (By licensed OT)     PT Frequency: 5xweek OT Frequency: 5xweek            Contractures      Additional Factors Info  Allergies, Code Status, Psychotropic Code Status Info: Full Allergies Info: IRBESARTAN, NSAIDS, AMLODIPINE, BUPRENORPHINE HCL, DIAZEPAM, HYDROCODONE-ACETAMINOPHEN, MORPHINE AND RELATED  Psychotropic Info: Cymbalta         Current Medications (02/06/2017):  This is the current hospital active medication list Current Facility-Administered Medications  Medication Dose Route Frequency Provider Last Rate Last Dose  . acetaminophen (TYLENOL) tablet 650 mg  650 mg Oral Q6H PRN Netta Cedars, MD   650 mg at 02/03/17 0419   Or  . acetaminophen (TYLENOL) suppository 650 mg  650 mg Rectal Q6H PRN Netta Cedars, MD      . allopurinol (ZYLOPRIM) tablet 100 mg  100 mg Oral Daily Netta Cedars, MD   100 mg at 02/06/17 0909  . aspirin chewable tablet 81 mg  81 mg Oral Daily Netta Cedars, MD   81 mg at 02/06/17 0908  . chlorthalidone (HYGROTON) tablet 12.5 mg  12.5 mg Oral Daily Netta Cedars, MD   12.5 mg at 02/06/17 0908  . docusate sodium (COLACE) capsule 100 mg  100 mg Oral BID Netta Cedars, MD   100 mg at 02/06/17 0908  .  DULoxetine (CYMBALTA) DR capsule 60 mg  60 mg Oral Daily Netta Cedars, MD   60 mg at 02/06/17 0909  . feeding supplement (ENSURE ENLIVE) (ENSURE ENLIVE) liquid 237 mL  237 mL Oral BID BM Netta Cedars, MD   237 mL at 02/06/17 0910  . fluticasone (FLONASE) 50 MCG/ACT nasal spray 1 spray  1 spray Each Nare Daily Netta Cedars, MD   1 spray at 02/06/17 [phone]  . fluticasone furoate-vilanterol (BREO ELLIPTA) 100-25 MCG/INH 1 puff  1 puff Inhalation Daily Netta Cedars, MD   1 puff at 02/06/17 [phone]  . ipratropium (ATROVENT) nebulizer solution 0.5 mg  0.5 mg  Nebulization Q6H PRN Netta Cedars, MD      . irbesartan Levy Sjogren) tablet 75 mg  75 mg Oral Daily Netta Cedars, MD   75 mg at 02/06/17 0908  . lactated ringers infusion   Intravenous Continuous Rica Koyanagi, MD 10 mL/hr at 01/29/17 0841    . menthol-cetylpyridinium (CEPACOL) lozenge 3 mg  1 lozenge Oral PRN Netta Cedars, MD       Or  . phenol [facility]) mouth spray 1 spray  1 spray Mouth/Throat PRN Netta Cedars, MD      . metoCLOPramide (REGLAN) tablet 5-10 mg  5-10 mg Oral Q8H PRN Netta Cedars, MD   5 mg at 02/04/17 0920   Or  . metoCLOPramide (REGLAN) injection 5-10 mg  5-10 mg Intravenous Q8H PRN Netta Cedars, MD   10 mg at 01/30/17 0936  . metoprolol succinate (TOPROL-XL) 24 hr tablet 50 mg  50 mg Oral QPM Skeet Latch, MD   50 mg at 02/05/17 1821  . ondansetron (ZOFRAN) tablet 4 mg  4 mg Oral Q6H PRN Netta Cedars, MD   4 mg at 01/31/17 1940   Or  . ondansetron [facility]) injection 4 mg  4 mg Intravenous Q6H PRN Netta Cedars, MD   4 mg at 01/30/17 2111  . pantoprazole (PROTONIX) EC tablet 40 mg  40 mg Oral BID AC Waldemar Dickens, MD   40 mg at 02/06/17 0908  . polyethylene glycol (MIRALAX / GLYCOLAX) packet 17 g  17 g Oral Daily PRN Netta Cedars, MD   17 g at 02/04/17 0919  . pravastatin (PRAVACHOL) tablet 40 mg  40 mg Oral q1800 Netta Cedars, MD   40 mg at 02/05/17 1822  . sucralfate (CARAFATE) 1 GM/10ML suspension 1 g  1 g Oral TID WC & HS Waldemar Dickens, MD   1 g at 02/06/17 0909  . traMADol (ULTRAM) tablet 50 mg  50 mg Oral Q4H PRN Netta Cedars, MD   50 mg at 02/05/17 0608     Discharge Medications: Please see discharge summary for a list of discharge medications.  Relevant Imaging Results:  Relevant Lab Results:   Additional Information [id number]  Truitt Merle, LCSW

## 2017-02-06 NOTE — Progress Notes (Signed)
Orthopedics Progress Note  Subjective: Patient feels good this morning and is ready for discharge to SNF Kristopher Ayers)  Objective:  Vitals:   02/06/17 0410 02/06/17 0814  BP:    Pulse:    Resp: 15   Temp:  98.3 F (36.8 C)    General: Awake and alert  Musculoskeletal: dressing changed, incision looks great, no erythema and no drainage Neurovascularly intact  Lab Results  Component Value Date   WBC 5.9 02/04/2017   HGB 10.0 (L) 02/05/2017   HCT 30.9 (L) 02/05/2017   MCV 96.0 02/04/2017   PLT 161 02/04/2017       Component Value Date/Time   NA 134 (L) 02/04/2017 0855   K 4.0 02/04/2017 0855   CL 100 (L) 02/04/2017 0855   CO2 24 02/04/2017 0855   GLUCOSE 105 (H) 02/04/2017 0855   BUN 29 (H) 02/04/2017 0855   CREATININE 1.17 02/04/2017 0855   CALCIUM 8.7 (L) 02/04/2017 0855   GFRNONAA 54 (L) 02/04/2017 0855   GFRAA >60 02/04/2017 0855    No results found for: INR, PROTIME  Assessment/Plan: POD #8 s/p Procedure(s): REVERSE RIGHT SHOULDER ARTHROPLASTY D/C to SNF  Remo Lipps R. Veverly Fells, MD 02/06/2017 9:13 AM

## 2017-02-06 NOTE — Clinical Social Work Placement (Signed)
   CLINICAL SOCIAL WORK PLACEMENT  NOTE  Date:  02/06/2017  Patient Details  Name: Kristopher Ayers MRN: [id number] Date of Birth: 04-07-1926  Clinical Social Work is seeking post-discharge placement for this patient at the [place] level of care (*CSW will initial, date and re-position this form in  chart as items are completed):  Yes   Patient/family provided with [facility] Work Department's list of facilities offering this level of care within the geographic area requested by the patient (or if unable, by the patient's family).  Yes   Patient/family informed of their freedom to choose among providers that offer the needed level of care, that participate in Medicare, Medicaid or managed care program needed by the patient, have an available bed and are willing to accept the patient.  Yes   Patient/family informed of [facility]'s ownership interest in [facility] and [facility], as well as of the fact that they are under no obligation to receive care at these facilities.  PASRR submitted to EDS on 02/01/17     PASRR number received on 02/01/17     Existing PASRR number confirmed on       FL2 transmitted to all facilities in geographic area requested by pt/family on 02/01/17     FL2 transmitted to all facilities within larger geographic area on       Patient informed that his/her managed care company has contracts with or will negotiate with certain facilities, including the following:        Yes   Patient/family informed of bed offers received.  [facility] at  [facility])     Physician recommends and patient chooses bed at      Patient to be transferred to  [facility]) on 02/06/17.  Patient to be transferred to facility by [facility]     Patient family notified on 02/06/17 of transfer.  Name of family member notified:  [facility]-daughter     PHYSICIAN       Additional Comment:  Pt ready for discharge to [facility]. Pt  and family agreed are aware and agreed to discharge plan. Family aware [facility] is out of network and will be responsible for a co-pay. Family discussed with [facility] at [facility]. CSW confirmed bed offer with Stanton Kidney at [facility] and sent clinicals. Room and report added to treatment team sticky note and RN aware. CSW arranged transportation with [facility]. CSW signing off as no further social work needs identified. _______________________________________________ Truitt Merle, LCSW 02/06/2017, 4:51 PM

## 2017-02-06 NOTE — Discharge Summary (Signed)
Physician Discharge Summary   Patient ID: Kristopher Ayers MRN: [id number] DOB/AGE: 1925/09/12 83 y.o.  Admit date: 01/29/2017 Discharge date: 02/06/2017  Admission Diagnoses:  Principal Problem:   S/P shoulder replacement, right Active Problems:   Hypertension   Hyperlipidemia   CAD (coronary artery disease)   Abdominal pain   Acute kidney injury superimposed on CKD (HCC)   Epigastric abdominal pain   Hyperglycemia   Bradycardia   Essential hypertension   CKD (chronic kidney disease), stage III   Arthritis of shoulder region, right, degenerative   Acute delirium   Troponin level elevated   Demand ischemia (HCC)   PVC's (premature ventricular contractions)   Discharge Diagnoses:  Same   Surgeries: Procedure(s): REVERSE RIGHT SHOULDER ARTHROPLASTY on 01/29/2017   Consultants: Cardiology, Internal Medicine, PT, OT, Social Work  Discharged Condition: Stable  Hospital Course: Kristopher Ayers is an 83 y.o. male who was admitted 01/29/2017 with a chief complaint of right shoulder pain, and found to have a diagnosis of right shoulder OA, rotator cuff insufficency.  They were brought to the operating room on 01/29/2017 and underwent the above named procedures.    The patient developed what was initially thought to be bradycardia with pulse rates monitored in the 30s and cardiology was consulted.  This was due to ventricular bigeminy as well as frequent PVCs that weren't picked up, thus the recorded low heart rate did not reflect his real pulse.  He had no hypotension nor symptoms through this period in the periop period.  Patient then developed heartburn and dysphagia which was worked up by internal medicine.  This was likely due to reflux which was treated and symptoms resolved.  He received therapy for mobilization and right shoulder AROM/ADLs throughout his hospital course and recommendation due to weakness and balance issues and requirement for significant assistance was for d/c to short  term SNF.  Family initially was divided on this but now there is a consensus that this is the best for the patient.  The final in hospital issue was one episode of unresponsiveness requiring transfer to step down.  Internal Medicine and Cardiology worked this up and felt it was likely due to multiple issues including lethargy and fluid status, however it resolved once transferred to stepdown and he has not had any similar events. Metoprolol dose was increased on 6/8 by cardiology and he was stable for discharge on 02/06/2017.  Plan is for follow up with Dr Veverly Fells in one week and Dr Peter Martinique in two weeks.  In rehab(SNF) will focus on AROM/ADLs with the right arm including some weight bearing(as tolerated), also will focus on mobilization/strength/balance and home safety adaptation.  Recent vital signs:  Vitals:   02/06/17 0814 02/06/17 0900  BP:    Pulse:    Resp:  (!) 28  Temp: 98.3 F (36.8 C)     Recent laboratory studies:  Results for orders placed or performed during the hospital encounter of 01/29/17  Hemoglobin and hematocrit, blood  Result Value Ref Range   Hemoglobin 11.7 (L) 13.0 - 17.0 g/dL   HCT 37.0 (L) 39.0 - 56.2 %  Basic metabolic panel  Result Value Ref Range   Sodium 139 135 - 145 mmol/L   Potassium 5.0 3.5 - 5.1 mmol/L   Chloride 108 101 - 111 mmol/L   CO2 23 22 - 32 mmol/L   Glucose, Bld 130 (H) 65 - 99 mg/dL   BUN 36 (H) 6 - 20 mg/dL   Creatinine, Ser  1.45 (H) 0.61 - 1.24 mg/dL   Calcium 8.6 (L) 8.9 - 10.3 mg/dL   GFR calc non Af Amer 41 (L) >60 mL/min   GFR calc Af Amer 48 (L) >60 mL/min   Anion gap 8 5 - 15  Basic metabolic panel  Result Value Ref Range   Sodium 138 135 - 145 mmol/L   Potassium 4.8 3.5 - 5.1 mmol/L   Chloride 106 101 - 111 mmol/L   CO2 21 (L) 22 - 32 mmol/L   Glucose, Bld 161 (H) 65 - 99 mg/dL   BUN 36 (H) 6 - 20 mg/dL   Creatinine, Ser 1.59 (H) 0.61 - 1.24 mg/dL   Calcium 8.3 (L) 8.9 - 10.3 mg/dL   GFR calc non Af Amer 37 (L) >60  mL/min   GFR calc Af Amer 43 (L) >60 mL/min   Anion gap 11 5 - 15  Magnesium  Result Value Ref Range   Magnesium 1.7 1.7 - 2.4 mg/dL  CBC  Result Value Ref Range   WBC 9.9 4.0 - 10.5 K/uL   RBC 3.83 (L) 4.22 - 5.81 MIL/uL   Hemoglobin 12.0 (L) 13.0 - 17.0 g/dL   HCT 37.5 (L) 39.0 - 52.0 %   MCV 97.9 78.0 - 100.0 fL   MCH 31.3 26.0 - 34.0 pg   MCHC 32.0 30.0 - 36.0 g/dL   RDW 13.8 11.5 - 15.5 %   Platelets 144 (L) 150 - 400 K/uL  TSH  Result Value Ref Range   TSH 1.269 0.350 - 4.500 uIU/mL  Basic metabolic panel  Result Value Ref Range   Sodium 136 135 - 145 mmol/L   Potassium 4.2 3.5 - 5.1 mmol/L   Chloride 102 101 - 111 mmol/L   CO2 24 22 - 32 mmol/L   Glucose, Bld 123 (H) 65 - 99 mg/dL   BUN 42 (H) 6 - 20 mg/dL   Creatinine, Ser 2.20 (H) 0.61 - 1.24 mg/dL   Calcium 8.0 (L) 8.9 - 10.3 mg/dL   GFR calc non Af Amer 25 (L) >60 mL/min   GFR calc Af Amer 29 (L) >60 mL/min   Anion gap 10 5 - 15  Basic metabolic panel  Result Value Ref Range   Sodium 137 135 - 145 mmol/L   Potassium 4.5 3.5 - 5.1 mmol/L   Chloride 105 101 - 111 mmol/L   CO2 24 22 - 32 mmol/L   Glucose, Bld 126 (H) 65 - 99 mg/dL   BUN 33 (H) 6 - 20 mg/dL   Creatinine, Ser 1.31 (H) 0.61 - 1.24 mg/dL   Calcium 8.4 (L) 8.9 - 10.3 mg/dL   GFR calc non Af Amer 47 (L) >60 mL/min   GFR calc Af Amer 54 (L) >60 mL/min   Anion gap 8 5 - 15  CBC with Differential/Platelet  Result Value Ref Range   WBC 6.1 4.0 - 10.5 K/uL   RBC 3.11 (L) 4.22 - 5.81 MIL/uL   Hemoglobin 9.6 (L) 13.0 - 17.0 g/dL   HCT 30.3 (L) 39.0 - 52.0 %   MCV 97.4 78.0 - 100.0 fL   MCH 30.9 26.0 - 34.0 pg   MCHC 31.7 30.0 - 36.0 g/dL   RDW 13.7 11.5 - 15.5 %   Platelets 141 (L) 150 - 400 K/uL   Neutrophils Relative % 66 %   Neutro Abs 4.0 1.7 - 7.7 K/uL   Lymphocytes Relative 20 %   Lymphs Abs 1.2 0.7 - 4.0 K/uL  Monocytes Relative 10 %   Monocytes Absolute 0.6 0.1 - 1.0 K/uL   Eosinophils Relative 4 %   Eosinophils Absolute 0.3 0.0 -  0.7 K/uL   Basophils Relative 0 %   Basophils Absolute 0.0 0.0 - 0.1 K/uL  Glucose, capillary  Result Value Ref Range   Glucose-Capillary 104 (H) 65 - 99 mg/dL  Glucose, capillary  Result Value Ref Range   Glucose-Capillary 132 (H) 65 - 99 mg/dL  Glucose, capillary  Result Value Ref Range   Glucose-Capillary 114 (H) 65 - 99 mg/dL  Basic metabolic panel  Result Value Ref Range   Sodium 134 (L) 135 - 145 mmol/L   Potassium 4.0 3.5 - 5.1 mmol/L   Chloride 102 101 - 111 mmol/L   CO2 24 22 - 32 mmol/L   Glucose, Bld 107 (H) 65 - 99 mg/dL   BUN 32 (H) 6 - 20 mg/dL   Creatinine, Ser 1.43 (H) 0.61 - 1.24 mg/dL   Calcium 8.4 (L) 8.9 - 10.3 mg/dL   GFR calc non Af Amer 42 (L) >60 mL/min   GFR calc Af Amer 49 (L) >60 mL/min   Anion gap 8 5 - 15  CBC  Result Value Ref Range   WBC 6.5 4.0 - 10.5 K/uL   RBC 2.98 (L) 4.22 - 5.81 MIL/uL   Hemoglobin 9.2 (L) 13.0 - 17.0 g/dL   HCT 29.0 (L) 39.0 - 52.0 %   MCV 97.3 78.0 - 100.0 fL   MCH 30.9 26.0 - 34.0 pg   MCHC 31.7 30.0 - 36.0 g/dL   RDW 13.4 11.5 - 15.5 %   Platelets 150 150 - 400 K/uL  Glucose, capillary  Result Value Ref Range   Glucose-Capillary 101 (H) 65 - 99 mg/dL  Glucose, capillary  Result Value Ref Range   Glucose-Capillary 118 (H) 65 - 99 mg/dL  Basic metabolic panel  Result Value Ref Range   Sodium 132 (L) 135 - 145 mmol/L   Potassium 4.1 3.5 - 5.1 mmol/L   Chloride 100 (L) 101 - 111 mmol/L   CO2 25 22 - 32 mmol/L   Glucose, Bld 119 (H) 65 - 99 mg/dL   BUN 35 (H) 6 - 20 mg/dL   Creatinine, Ser 1.31 (H) 0.61 - 1.24 mg/dL   Calcium 8.6 (L) 8.9 - 10.3 mg/dL   GFR calc non Af Amer 47 (L) >60 mL/min   GFR calc Af Amer 54 (L) >60 mL/min   Anion gap 7 5 - 15  CBC  Result Value Ref Range   WBC 5.9 4.0 - 10.5 K/uL   RBC 3.30 (L) 4.22 - 5.81 MIL/uL   Hemoglobin 10.1 (L) 13.0 - 17.0 g/dL   HCT 31.6 (L) 39.0 - 52.0 %   MCV 95.8 78.0 - 100.0 fL   MCH 30.6 26.0 - 34.0 pg   MCHC 32.0 30.0 - 36.0 g/dL   RDW 13.4 11.5 -  15.5 %   Platelets 159 150 - 400 K/uL  Glucose, capillary  Result Value Ref Range   Glucose-Capillary 107 (H) 65 - 99 mg/dL  Hepatic function panel  Result Value Ref Range   Total Protein 5.5 (L) 6.5 - 8.1 g/dL   Albumin 2.7 (L) 3.5 - 5.0 g/dL   AST 19 15 - 41 U/L   ALT 11 (L) 17 - 63 U/L   Alkaline Phosphatase 50 38 - 126 U/L   Total Bilirubin 1.0 0.3 - 1.2 mg/dL   Bilirubin, Direct 0.2 0.1 -  0.5 mg/dL   Indirect Bilirubin 0.8 0.3 - 0.9 mg/dL  Magnesium  Result Value Ref Range   Magnesium 1.7 1.7 - 2.4 mg/dL  Ammonia  Result Value Ref Range   Ammonia 20 9 - 35 umol/L  Urinalysis, Routine w reflex microscopic  Result Value Ref Range   Color, Urine YELLOW YELLOW   APPearance CLEAR CLEAR   Specific Gravity, Urine 1.013 1.005 - 1.030   pH 5.0 5.0 - 8.0   Glucose, UA NEGATIVE NEGATIVE mg/dL   Hgb urine dipstick NEGATIVE NEGATIVE   Bilirubin Urine NEGATIVE NEGATIVE   Ketones, ur NEGATIVE NEGATIVE mg/dL   Protein, ur 100 (A) NEGATIVE mg/dL   Nitrite NEGATIVE NEGATIVE   Leukocytes, UA NEGATIVE NEGATIVE   RBC / HPF 0-5 0 - 5 RBC/hpf   WBC, UA 0-5 0 - 5 WBC/hpf   Bacteria, UA NONE SEEN NONE SEEN   Squamous Epithelial / LPF NONE SEEN NONE SEEN   Mucous PRESENT   Lactic acid, plasma  Result Value Ref Range   Lactic Acid, Venous 0.9 0.5 - 1.9 mmol/L  Troponin I (q 6hr x 3)  Result Value Ref Range   Troponin I 0.06 (HH) <0.03 ng/mL  Troponin I (q 6hr x 3)  Result Value Ref Range   Troponin I 0.07 (HH) <0.03 ng/mL  Troponin I (q 6hr x 3)  Result Value Ref Range   Troponin I 0.05 (HH) <0.03 ng/mL  CBC  Result Value Ref Range   WBC 5.9 4.0 - 10.5 K/uL   RBC 3.03 (L) 4.22 - 5.81 MIL/uL   Hemoglobin 9.3 (L) 13.0 - 17.0 g/dL   HCT 29.1 (L) 39.0 - 52.0 %   MCV 96.0 78.0 - 100.0 fL   MCH 30.7 26.0 - 34.0 pg   MCHC 32.0 30.0 - 36.0 g/dL   RDW 13.4 11.5 - 15.5 %   Platelets 161 150 - 400 K/uL  Basic metabolic panel  Result Value Ref Range   Sodium 134 (L) 135 - 145 mmol/L    Potassium 4.0 3.5 - 5.1 mmol/L   Chloride 100 (L) 101 - 111 mmol/L   CO2 24 22 - 32 mmol/L   Glucose, Bld 105 (H) 65 - 99 mg/dL   BUN 29 (H) 6 - 20 mg/dL   Creatinine, Ser 1.17 0.61 - 1.24 mg/dL   Calcium 8.7 (L) 8.9 - 10.3 mg/dL   GFR calc non Af Amer 54 (L) >60 mL/min   GFR calc Af Amer >60 >60 mL/min   Anion gap 10 5 - 15  Glucose, capillary  Result Value Ref Range   Glucose-Capillary 95 65 - 99 mg/dL   Comment 1 Notify RN    Comment 2 Document in Chart   Glucose, capillary  Result Value Ref Range   Glucose-Capillary 160 (H) 65 - 99 mg/dL   Comment 1 Notify RN    Comment 2 Document in Chart   Glucose, capillary  Result Value Ref Range   Glucose-Capillary 112 (H) 65 - 99 mg/dL   Comment 1 Notify RN    Comment 2 Document in Chart   Glucose, capillary  Result Value Ref Range   Glucose-Capillary 110 (H) 65 - 99 mg/dL   Comment 1 Notify RN    Comment 2 Document in Chart   Glucose, capillary  Result Value Ref Range   Glucose-Capillary 109 (H) 65 - 99 mg/dL  Glucose, capillary  Result Value Ref Range   Glucose-Capillary 114 (H) 65 - 99 mg/dL  Hemoglobin  and hematocrit, blood  Result Value Ref Range   Hemoglobin 10.0 (L) 13.0 - 17.0 g/dL   HCT 30.9 (L) 39.0 - 52.0 %  Glucose, capillary  Result Value Ref Range   Glucose-Capillary 148 (H) 65 - 99 mg/dL  Glucose, capillary  Result Value Ref Range   Glucose-Capillary 118 (H) 65 - 99 mg/dL  Glucose, capillary  Result Value Ref Range   Glucose-Capillary 143 (H) 65 - 99 mg/dL   Comment 1 Notify RN     Discharge Medications:   Allergies as of 02/06/2017      Reactions   Irbesartan Cough   WEAKNESS   Nsaids Rash, Other (See Comments)   RENAL INSUFFICIENCY   Amlodipine Swelling   SWELLING REACTION UNSPECIFIED    Buprenorphine Hcl    UNSPECIFIED REACTION    Diazepam    UNSPECIFIED REACTION    Hydrocodone-acetaminophen Rash   Morphine And Related Rash      Medication List    TAKE these medications   acetaminophen  325 MG tablet Commonly known as:  TYLENOL Take 650 mg by mouth every 6 (six) hours as needed for moderate pain. What changed:  Another medication with the same name was added. Make sure you understand how and when to take each.   acetaminophen 325 MG tablet Commonly known as:  TYLENOL Take 2 tablets (650 mg total) by mouth every 6 (six) hours as needed for mild pain (or Fever >/= 101). What changed:  You were already taking a medication with the same name, and this prescription was added. Make sure you understand how and when to take each.   allopurinol 100 MG tablet Commonly known as:  ZYLOPRIM Take 100 mg by mouth daily.   aspirin 81 MG tablet Take 81 mg by mouth daily.   B-12 PO Take 1 tablet by mouth daily.   BREO ELLIPTA 100-25 MCG/INH Aepb Generic drug:  fluticasone furoate-vilanterol Inhale 1 puff into the lungs daily.   chlorthalidone 25 MG tablet Commonly known as:  HYGROTON Take 12.5 mg by mouth daily.   DULoxetine 60 MG capsule Commonly known as:  CYMBALTA Take 60 mg by mouth daily.   ferrous sulfate 300 (60 Fe) MG/5ML syrup Take 5 mLs (300 mg total) by mouth 2 (two) times daily with a meal.   fluticasone 50 MCG/ACT nasal spray Commonly known as:  FLONASE 1 spray by Each Nare route daily.   ipratropium 17 MCG/ACT inhaler Commonly known as:  ATROVENT HFA Inhale 2 puffs into the lungs every 6 (six) hours as needed for wheezing.   metoprolol succinate 25 MG 24 hr tablet Commonly known as:  TOPROL-XL Take 25 mg by mouth every evening.   pantoprazole 40 MG tablet Commonly known as:  PROTONIX Take 1 tablet (40 mg total) by mouth 2 (two) times daily before a meal.   pravastatin 40 MG tablet Commonly known as:  PRAVACHOL Take 40 mg by mouth daily.   sucralfate 1 GM/10ML suspension Commonly known as:  CARAFATE Take 10 mLs (1 g total) by mouth 4 (four) times daily -  with meals and at bedtime.   traMADol 50 MG tablet Commonly known as:  ULTRAM Take 1  tablet (50 mg total) by mouth every 6 (six) hours as needed for moderate pain.   valsartan 80 MG tablet Commonly known as:  DIOVAN Take 80 mg by mouth daily.       Diagnostic Studies: Ct Head Wo Contrast  Result Date: 02/03/2017 CLINICAL DATA:  Altered mental  status EXAM: CT HEAD WITHOUT CONTRAST TECHNIQUE: Contiguous axial images were obtained from the base of the skull through the vertex without intravenous contrast. COMPARISON:  None. FINDINGS: Brain: No mass lesion, intraparenchymal hemorrhage or extra-axial collection. No evidence of acute cortical infarct. There is periventricular hypoattenuation compatible with chronic microvascular disease. Vascular: No hyperdense vessel or unexpected calcification. Skull: Normal visualized skull base, calvarium and extracranial soft tissues. Sinuses/Orbits: No sinus fluid levels or advanced mucosal thickening. No mastoid effusion. Normal orbits. IMPRESSION: No acute intracranial abnormality. Electronically Signed   By: Ulyses Jarred M.D.   On: 02/03/2017 22:32   Dg Chest Port 1 View  Result Date: 02/03/2017 CLINICAL DATA:  Lethargy EXAM: PORTABLE CHEST 1 VIEW COMPARISON:  12/16/2015 FINDINGS: Post sternotomy changes. Cardiomegaly with central vascular congestion. Increased opacity at the right base with probable adjacent small effusion. Aortic atherosclerosis. No pneumothorax. IMPRESSION: 1. Hazy infiltrate at the right base with small right pleural effusion. 2. Cardiomegaly with central vascular congestion. Electronically Signed   By: Donavan Foil M.D.   On: 02/03/2017 20:12   Dg Shoulder Right Port  Result Date: 01/29/2017 CLINICAL DATA:  Status post right shoulder replacement. EXAM: PORTABLE RIGHT SHOULDER COMPARISON:  Radiographs of same day. FINDINGS: The glenoid and humeral components appear to be well situated. No fracture or dislocation is noted. Expected postoperative changes are seen in the surrounding soft tissues. IMPRESSION: Status post right  shoulder arthroplasty. Electronically Signed   By: Marijo Conception, M.D.   On: 01/29/2017 14:09   Dg Shoulder Right Port  Result Date: 01/29/2017 CLINICAL DATA:  Preoperative exam for shoulder arthroplasty. EXAM: PORTABLE RIGHT SHOULDER COMPARISON:  12/16/2015 FINDINGS: Two views show advanced osteoarthritis of the glenohumeral joint with joint space narrowing and marginal osteophytes. Moderate narrowing of the humeral acromial distance. Previous distal clavicular resection. Regional ribs appear normal. IMPRESSION: Chronic osteoarthritis of the glenohumeral joint. Electronically Signed   By: Nelson Chimes M.D.   On: 01/29/2017 10:20   Dg Abd Portable 1v  Result Date: 02/01/2017 CLINICAL DATA:  Abdominal pain and distention, difficulty swallowing foods occurring the family, history hypertension, hyperlipidemia, kidney stones, COPD, chronic kidney disease, coronary artery disease, colonic diverticulosis EXAM: PORTABLE ABDOMEN - 1 VIEW COMPARISON:  CT abdomen and pelvis 10/08/2009 FINDINGS: Scattered gas throughout air-filled nondistended colon. Small bowel gas pattern normal. No bowel dilatation or bowel wall thickening. Bones appear slightly demineralized with evidence of prior lumbar fusion. No definite urinary tract calcification. IMPRESSION: Nonspecific bowel gas pattern. Electronically Signed   By: Lavonia Dana M.D.   On: 02/01/2017 17:02    Disposition: Final discharge disposition not confirmed  Discharge Instructions    Call MD / Call 911    Complete by:  As directed    If you experience chest pain or shortness of breath, CALL 911 and be transported to the hospital emergency room.  If you develope a fever above 101 F, pus (white drainage) or increased drainage or redness at the wound, or calf pain, call your surgeon's office.   Call MD / Call 911    Complete by:  As directed    If you experience chest pain or shortness of breath, CALL 911 and be transported to the hospital emergency room.  If you  develope a fever above 101 F, pus (white drainage) or increased drainage or redness at the wound, or calf pain, call your surgeon's office.   Call MD / Call 911    Complete by:  As directed  If you experience chest pain or shortness of breath, CALL 911 and be transported to the hospital emergency room.  If you develope a fever above 101 F, pus (white drainage) or increased drainage or redness at the wound, or calf pain, call your surgeon's office.   Call MD / Call 911    Complete by:  As directed    If you experience chest pain or shortness of breath, CALL 911 and be transported to the hospital emergency room.  If you develope a fever above 101 F, pus (white drainage) or increased drainage or redness at the wound, or calf pain, call your surgeon's office.   Constipation Prevention    Complete by:  As directed    Drink plenty of fluids.  Prune juice may be helpful.  You may use a stool softener, such as Colace (over the counter) 100 mg twice a day.  Use MiraLax (over the counter) for constipation as needed.   Constipation Prevention    Complete by:  As directed    Drink plenty of fluids.  Prune juice may be helpful.  You may use a stool softener, such as Colace (over the counter) 100 mg twice a day.  Use MiraLax (over the counter) for constipation as needed.   Constipation Prevention    Complete by:  As directed    Drink plenty of fluids.  Prune juice may be helpful.  You may use a stool softener, such as Colace (over the counter) 100 mg twice a day.  Use MiraLax (over the counter) for constipation as needed.   Constipation Prevention    Complete by:  As directed    Drink plenty of fluids.  Prune juice may be helpful.  You may use a stool softener, such as Colace (over the counter) 100 mg twice a day.  Use MiraLax (over the counter) for constipation as needed.   Diet - low sodium heart healthy    Complete by:  As directed    Diet - low sodium heart healthy    Complete by:  As directed    Diet  - low sodium heart healthy    Complete by:  As directed    Diet - low sodium heart healthy    Complete by:  As directed    Increase activity slowly as tolerated    Complete by:  As directed    Increase activity slowly as tolerated    Complete by:  As directed    Increase activity slowly as tolerated    Complete by:  As directed    Increase activity slowly as tolerated    Complete by:  As directed        Contact information for follow-up providers    Netta Cedars, MD. Call in 1 week(s).   Specialty:  Orthopedic Surgery Why:  [phone] Contact information: [address] [place] [address] [phone]        Almyra Deforest, [place] Follow up.   Specialties:  Cardiology, Radiology Contact information: [address] [place] [address] [phone]        Almyra Deforest, [place]. Go on 02/18/2017.   Specialties:  Cardiology, Radiology Why:  Your appointment time will be for 11 am with Octaviano Batty, PA-C, Dr. Morrison Old team member. Please arrive 15 minutes early.    I have ordered a echocardiogram and a Nuclear Stress test. The office will be calling you within the next few days to schedule these. Contact information: [address]  [place] [address] [phone]            Contact information for after-discharge care    Destination    [facility] Follow up.   Specialty:  [place] information: [address] [place] [phone]                   Signed: Augustin Schooling 02/06/2017, 9:19 AM

## 2017-02-06 NOTE — Progress Notes (Signed)
Progress Note  Patient Name: Kristopher Ayers Date of Encounter: 02/06/2017  Primary Cardiologist: Dr. Martinique  Subjective   Feeling well.  Denies chest pain, shortness of breath or palpitations. No dizziness  Inpatient Medications    Scheduled Meds: . allopurinol  100 mg Oral Daily  . aspirin  81 mg Oral Daily  . chlorthalidone  12.5 mg Oral Daily  . docusate sodium  100 mg Oral BID  . DULoxetine  60 mg Oral Daily  . feeding supplement (ENSURE ENLIVE)  237 mL Oral BID BM  . fluticasone  1 spray Each Nare Daily  . fluticasone furoate-vilanterol  1 puff Inhalation Daily  . irbesartan  75 mg Oral Daily  . metoprolol succinate  50 mg Oral QPM  . pantoprazole  40 mg Oral BID AC  . pravastatin  40 mg Oral q1800  . sucralfate  1 g Oral TID WC & HS   Continuous Infusions: . lactated ringers 10 mL/hr at 01/29/17 0841   PRN Meds: acetaminophen **OR** acetaminophen, ipratropium, menthol-cetylpyridinium **OR** phenol, metoCLOPramide **OR** metoCLOPramide (REGLAN) injection, ondansetron **OR** ondansetron (ZOFRAN) IV, polyethylene glycol, traMADol   Vital Signs    Vitals:   02/06/17 0025 02/06/17 0316 02/06/17 0410 02/06/17 0814  BP: (!) 153/53 (!) 135/58    Pulse:      Resp: 14 (!) 21 15   Temp:  98.4 F (36.9 C)  98.3 F (36.8 C)  TempSrc:  Oral  Oral  SpO2: 96%  95%   Weight:      Height:        Intake/Output Summary (Last 24 hours) at 02/06/17 0857 Last data filed at 02/06/17 0554  Gross per 24 hour  Intake              240 ml  Output             1175 ml  Net             -935 ml   Filed Weights   01/29/17 0832  Weight: 206 lb (93.4 kg)    Telemetry    Sinus rhythm with frequent PVCs with pattern of ventricular bigeminy/trigeminy.  - Personally Reviewed  ECG    N/a  - Personally Reviewed  Physical Exam   GEN: No acute distress.   Neck: No JVD or bruits Cardiac: RRR with frequent extrasystoles, no murmurs, rubs, or gallops.  Respiratory: Clear to  auscultation bilaterally. GI: Soft, nontender, non-distended  MS: No edema; No deformity. Neuro:  Nonfocal  Psych: Normal affect  Skin: R UE ecchymosis and edema. Surgical dressing in place right shoulder.   Labs    Chemistry  Recent Labs Lab 02/03/17 0527 02/03/17 1916 02/03/17 1945 02/04/17 0855  NA 134* 132*  --  134*  K 4.0 4.1  --  4.0  CL 102 100*  --  100*  CO2 24 25  --  24  GLUCOSE 107* 119*  --  105*  BUN 32* 35*  --  29*  CREATININE 1.43* 1.31*  --  1.17  CALCIUM 8.4* 8.6*  --  8.7*  PROT  --   --  5.5*  --   ALBUMIN  --   --  2.7*  --   AST  --   --  19  --   ALT  --   --  11*  --   ALKPHOS  --   --  50  --   BILITOT  --   --  1.0  --  GFRNONAA 42* 47*  --  60*  GFRAA 49* 54*  --  >60  ANIONGAP 8 7  --  10     Hematology  Recent Labs Lab 02/03/17 0527 02/03/17 1916 02/04/17 0855 02/05/17 1312  WBC 6.5 5.9 5.9  --   RBC 2.98* 3.30* 3.03*  --   HGB 9.2* 10.1* 9.3* 10.0*  HCT 29.0* 31.6* 29.1* 30.9*  MCV 97.3 95.8 96.0  --   MCH 30.9 30.6 30.7  --   MCHC 31.7 32.0 32.0  --   RDW 13.4 13.4 13.4  --   PLT 150 159 161  --     Cardiac Enzymes  Recent Labs Lab 02/03/17 2102 02/04/17 0301 02/04/17 0855  TROPONINI 0.06* 0.07* 0.05*   No results for input(s): TROPIPOC in the last 168 hours.   BNPNo results for input(s): BNP, PROBNP in the last 168 hours.   DDimer No results for input(s): DDIMER in the last 168 hours.   Radiology    No results found.  Cardiac Studies   Last echo per [facility] done at [facility] 09/18/2015 Summary Ejection fraction is visually estimated at 50-55% The left ventricle was not well visualized, but grossly low normal LV Function. Probable mild to moderate left ventricular hypertrophy   Patient Profile     83 y.o. male with CAD s/p CABG in 2001, PCI tyo LCx and OM1 in 2016, hypertension, COPD, hyperlipidemia and CKD III here with R shoulder surgery.  Noted to have PVCs and mildly elevated  troponin post-op.   Assessment & Plan    1.  CAD s/p CABG and PCI: no active angina. Will continue ASA, Metoprolol. Significance of mildly elevated troponin unclear. I suspect this is more demand ischemia related to stress of surgery and anemia. Will consider outpatient work up once recovered from surgery  2.  PVCs: with bigeminy and trigeminy. Asymptomatic. Toprol dose increased to 50 mg daily. Electrolytes OK. Will check Echo- may be done as outpatient.  3.  Hypertension: Blood pressure is controlled.  Continue chlorthalidone, irbesartan and increase metoprolol as above.    Patient is stable from a cardiac standpoint. We will arrange follow up in my office in 2-3 weeks.   Signed, Bessye Stith Martinique, MD  02/06/2017, 8:57 AM

## 2017-02-08 ENCOUNTER — Other Ambulatory Visit: Payer: Self-pay

## 2017-02-08 DIAGNOSIS — I2510 Atherosclerotic heart disease of native coronary artery without angina pectoris: Secondary | ICD-10-CM

## 2017-02-08 NOTE — Addendum Note (Signed)
Addendum  created 02/08/17 0756 by Rica Koyanagi, MD   Anesthesia Intra Blocks edited, Sign clinical note

## 2017-02-09 NOTE — Telephone Encounter (Signed)
02/05/2017 03:38 PM Phone (Outgoing) Kristopher Ayers, [name] (Self) [phone] (H)   Left Message - Called pt and lmsg for him to CB to get scheduled for echo and myoview.     By Verdene Rio

## 2017-02-10 ENCOUNTER — Telehealth (HOSPITAL_COMMUNITY): Payer: Self-pay | Admitting: Cardiology

## 2017-02-11 ENCOUNTER — Encounter: Payer: Self-pay | Admitting: Cardiology

## 2017-02-11 ENCOUNTER — Ambulatory Visit (INDEPENDENT_AMBULATORY_CARE_PROVIDER_SITE_OTHER): Payer: Medicare HMO | Admitting: Cardiology

## 2017-02-11 VITALS — BP 142/56 | HR 71 | Ht 65.0 in | Wt 209.2 lb

## 2017-02-11 DIAGNOSIS — I25708 Atherosclerosis of coronary artery bypass graft(s), unspecified, with other forms of angina pectoris: Secondary | ICD-10-CM | POA: Diagnosis not present

## 2017-02-11 DIAGNOSIS — E7800 Pure hypercholesterolemia, unspecified: Secondary | ICD-10-CM | POA: Diagnosis not present

## 2017-02-11 DIAGNOSIS — I493 Ventricular premature depolarization: Secondary | ICD-10-CM | POA: Diagnosis not present

## 2017-02-11 DIAGNOSIS — I10 Essential (primary) hypertension: Secondary | ICD-10-CM

## 2017-02-11 DIAGNOSIS — J449 Chronic obstructive pulmonary disease, unspecified: Secondary | ICD-10-CM

## 2017-02-11 NOTE — Telephone Encounter (Signed)
Please call asap,pt's heart rate is real low.

## 2017-02-11 NOTE — Telephone Encounter (Signed)
Returned call to patient's daughter Rip Harbour.She stated father's heart beat 37 when he walked for PT at [facility] this morning.B/P 128/78.Stated he had a echo done at [facility] yesterday and was told echo was abnormal.  Stated he feels ok just weak from recent shoulder surgery.Stated she would like him to see Dr.Jordan and cancel PA appointment 02/18/17.Stated he has appointment with [facility] at 2:00 pm this afternoon.Appointment scheduled with Dr.Jordan this afternoon at 4:40 pm. Spoke to [place] at [facility] she will fax preliminary echo results.

## 2017-02-11 NOTE — Progress Notes (Signed)
Cardiology Office Note    Date:  02/11/2017   ID:  Kristopher Ayers, DOB 03-12-1926, MRN [id number]  PCP:  Lilian Coma., MD  Cardiologist:  Keeton Kassebaum Martinique, MD    History of Present Illness:  Kristopher Ayers is a 83 y.o. male seen for evaluation of bradycardia.   He has a history of CAD and is s/p CABG in 2001. This included LIMA to the LAD, SVG to Ramus, and SVG to RCA. He did well until the end of 2016 when he developed progressive dyspnea on exertion. A stress test was abnormal and this led to a cardiac cath. Cath films reviewed from 09/18/15: Native LAD and RCA occluded. First OM occluded. Mid LCX and second OM with obstructive disease that was successfully stented. LIMA sequential to diagonal and LAD is patent. SVG to OM1 is patent. SVG to PDA is patent-somewhat aneurysmal. Focal ulcerative but nonobstructive plaque in the proximal SVG body. The LCx was stented with a 3.0 x 15 mm Xience stent and the OM with a 2.75 x 15 mm Xience stent. Echo at that time showed normal LV function with EF 50-55%. There was aortic valve sclerosis without stenosis. He has a history of remote tobacco use 50 years ago, COPD, HTN, and hyperlipidemia. Also family history of CAD.  Recently he underwent surgery on his right shoulder by Dr. Veverly Fells. Post op he was noted to have frequent PVCs with bigeminy. He was seen by my cardiology colleagues. Electrolytes were normal. Patient was asymptomatic. Troponin levels minimally elevated with flat trend. Increase metoprolol dose was mentioned but ultimately patient discharge on 25 mg daily. Now in a nursing facility. Nurse called today concerned about bradycardia with HR 37. Again patient asymptomatic. Denies dyspnea, chest pain, palpitations or dizziness. Feels he is progressing with PT and hopes to go home in a week. TFTs checked and were normal. Echo ordered and done at facility by a mobile health service. No official report available but preliminary note "aortic stenosis".     Past Medical History:  Diagnosis Date  . CAD (coronary artery disease)    CABG 2001; DES to Cx and OM1 09/18/15  . CKD (chronic kidney disease)   . COPD (chronic obstructive pulmonary disease) ([place])   . Diverticulosis    patient denies  . DJD (degenerative joint disease)   . Hyperlipidemia   . Hypertension   . Internal hemorrhoids    patient denies  . Kidney stone     Past Surgical History:  Procedure Laterality Date  . ANKLE FUSION    . APPENDECTOMY    . CHOLECYSTECTOMY    . COLONOSCOPY    . CORONARY ARTERY BYPASS GRAFT  2001  . INGUINAL HERNIA REPAIR    . LEG SURGERY    . LUMBAR LAMINECTOMY     x3  . REVERSE SHOULDER ARTHROPLASTY Right 01/29/2017   Procedure: REVERSE RIGHT SHOULDER ARTHROPLASTY;  Surgeon: Netta Cedars, MD;  Location: [facility];  Service: Orthopedics;  Laterality: Right;  . ROTATOR CUFF REPAIR     x 2  . TONSILLECTOMY AND ADENOIDECTOMY    . TOTAL KNEE ARTHROPLASTY     left    Current Medications: Allergies as of 02/11/2017      Reactions   Irbesartan Cough   WEAKNESS   Nsaids Rash, Other (See Comments)   RENAL INSUFFICIENCY   Amlodipine Swelling   SWELLING REACTION UNSPECIFIED    Buprenorphine Hcl    UNSPECIFIED REACTION    Diazepam  UNSPECIFIED REACTION    Hydrocodone-acetaminophen Rash   Morphine And Related Rash      Medication List       Accurate as of 02/11/17  5:51 PM. Always use your most recent med list.          acetaminophen 325 MG tablet Commonly known as:  TYLENOL Take 650 mg by mouth every 6 (six) hours as needed for moderate pain.   allopurinol 100 MG tablet Commonly known as:  ZYLOPRIM Take 100 mg by mouth daily.   aspirin 81 MG tablet Take 81 mg by mouth daily.   B-12 PO Take 1 tablet by mouth daily.   BREO ELLIPTA 100-25 MCG/INH Aepb Generic drug:  fluticasone furoate-vilanterol Inhale 1 puff into the lungs daily.   chlorthalidone 25 MG tablet Commonly known as:  HYGROTON Take 12.5 mg by mouth daily.    DULoxetine 60 MG capsule Commonly known as:  CYMBALTA Take 60 mg by mouth daily.   ferrous sulfate 300 (60 Fe) MG/5ML syrup Take 5 mLs (300 mg total) by mouth 2 (two) times daily with a meal.   fluticasone 50 MCG/ACT nasal spray Commonly known as:  FLONASE 1 spray by Each Nare route daily.   ipratropium 17 MCG/ACT inhaler Commonly known as:  ATROVENT HFA Inhale 2 puffs into the lungs every 6 (six) hours as needed for wheezing.   metoprolol succinate 25 MG 24 hr tablet Commonly known as:  TOPROL-XL Take 25 mg by mouth daily.   pantoprazole 40 MG tablet Commonly known as:  PROTONIX Take 1 tablet (40 mg total) by mouth 2 (two) times daily before a meal.   pravastatin 40 MG tablet Commonly known as:  PRAVACHOL Take 40 mg by mouth daily.   sucralfate 1 GM/10ML suspension Commonly known as:  CARAFATE Take 10 mLs (1 g total) by mouth 4 (four) times daily -  with meals and at bedtime.   traMADol 50 MG tablet Commonly known as:  ULTRAM Take 1 tablet (50 mg total) by mouth every 6 (six) hours as needed for moderate pain.   valsartan 80 MG tablet Commonly known as:  DIOVAN Take 80 mg by mouth daily.        Allergies:   Irbesartan; Nsaids; Amlodipine; Buprenorphine hcl; Diazepam; Hydrocodone-acetaminophen; and Morphine and related   Social History   Social History  . Marital status: Married    Spouse name: N/A  . Number of children: 3  . Years of education: N/A   Occupational History  . Retired [employer]   Social History Main Topics  . Smoking status: Former Research scientist (life sciences)  . Smokeless tobacco: Never Used  . Alcohol use No  . Drug use: No  . Sexual activity: Not Asked   Other Topics Concern  . None   Social History Narrative   Daily caffeine      Family History:  The patient's family history includes CAD in his brother; Congestive Heart Failure in his father.   ROS:   Please see the history of present illness.    ROS All other systems reviewed and are  negative.   PHYSICAL EXAM:   VS:  BP (!) 142/56   Pulse 71   Ht 5\' 5"  (1.651 m)   Wt 209 lb 3.2 oz (94.9 kg)   BMI 34.81 kg/m    GEN: Well nourished, obese, in no acute distress. Seen in a wheelchair HEENT: normal  Neck: no JVD, carotid bruits, or masses Cardiac: RRR; normal S1-2, no gallop or rub.  Gr 2/6 systolic ejection murmur RUSB. He has 1-2+ pretibial edema. Respiratory:  clear to auscultation bilaterally, normal work of breathing GI: soft, nontender, nondistended, + BS MS: no deformity or atrophy. Some swelling in right arm. Skin: warm and dry, no rash Neuro:  Alert and Oriented x 3, Strength and sensation are intact Psych: euthymic mood, full affect  Wt Readings from Last 3 Encounters:  02/11/17 209 lb 3.2 oz (94.9 kg)  01/29/17 206 lb (93.4 kg)  01/21/17 206 lb 6.4 oz (93.6 kg)      Studies/Labs Reviewed:   EKG:  EKG is  ordered today.  NSR with frequent PVCs in pattern of bigeminy. Rate 71. LVH with repolarization abnormality. I have personally reviewed and interpreted this study.    Recent Labs: 01/29/2017: TSH 1.269 02/03/2017: ALT 11; Magnesium 1.7 02/04/2017: BUN 29; Creatinine, Ser 1.17; Platelets 161; Potassium 4.0; Sodium 134 02/05/2017: Hemoglobin 10.0   Lipid Panel No results found for: CHOL, TRIG, HDL, CHOLHDL, VLDL, LDLCALC, LDLDIRECT  Additional studies/ records that were reviewed today include:  [facility] from [facility] via [facility] Lipid panel in Dec. 2016 show cholesterol 149, trig- 115, HDL 45, LDL 81.  CBC and CMET normal in April 2017.   Labs dated 02/09/17: Hgb 10.2, TFTs normal. BUN 24. Creatinine 1.31. Sodium 130.    ASSESSMENT:    1. PVC (premature ventricular contraction)   2. Essential hypertension   3. Coronary artery disease of bypass graft of native heart with stable angina pectoris ([place])   4. Pure hypercholesterolemia   5. Chronic obstructive pulmonary disease, unspecified COPD type ([place])      PLAN:  In order of  problems listed above:  1. Patient does not have bradycardia. Instead he has PVCs in a pattern of bigeminy with undercounting of his HR. This was noted in hospital and is benign. Electrolytes normal. Thyroid normal. I would continue current dose of Toprol XL. Further increase in beta blocker may result in more problematic lowering of effective HR. Hopefully as he recovers from his recent surgery this will improve. Will await results of Echo. Will arrange follow up in 4 weeks.  2. On statin therapy with good control. 3. CAD s/p CABG 2001. S/p Stenting of the LCx and OM1 in Jan 2017 with DES. On ASA only now. No anginal symptoms. 4. Dyspnea. I suspect this is more related to COPD. Will add Spiriva daily.  5. ? Aortic stenosis. Echo in Jan 2017 did not show significant aortic stenosis so I would be shocked if current AS shows hemodynamically significant AS. Will hopefully receive a copy of official report. If there is a major discrepancy from prior Echo we may need to repeat in our own lab.  The family reports that there was some consideration given at the nursing facility for giving him IV fluid- concerned that he is dehydrated. Lab work done 2 days ago show improvement in creatinine. If anything he may be volume overloaded. His sodium is low which may reflect more chlorthalidone or SIADH. I would avoid IV fluids.  Medication Adjustments/Labs and Tests Ordered: Current medicines are reviewed at length with the patient today.  Concerns regarding medicines are outlined above.  Medication changes, Labs and Tests ordered today are listed in the Patient Instructions below. Patient Instructions  Recommend reducing Toprol xl to 25 mg daily  He does not need IV fluids.   Will await official results of Echo     Signed, Jamayia Croker Martinique, MD  02/11/2017 5:51 PM  [facility] [address], [place], [place], [address] [phone]

## 2017-02-11 NOTE — Patient Instructions (Signed)
Recommend reducing Toprol xl to 25 mg daily  He does not need IV fluids.   Will await official results of Echo

## 2017-02-11 NOTE — Telephone Encounter (Signed)
New message     Pt daughter states he is too weak to have myocardial perfusion done, and she wants to see Dr Martinique instead of PA, she thinks the echo was done through the nursing home

## 2017-02-18 ENCOUNTER — Ambulatory Visit: Payer: Medicare HMO | Admitting: Physician Assistant

## 2017-03-11 ENCOUNTER — Telehealth: Payer: Self-pay | Admitting: Cardiology

## 2017-03-11 NOTE — Telephone Encounter (Signed)
Message sent to Dr. Martinique and cheryl asking if ok to switch to Ellipta inhaler.

## 2017-03-11 NOTE — Telephone Encounter (Signed)
Agree  Arrick Dutton MD, FACC   

## 2017-03-11 NOTE — Telephone Encounter (Signed)
New message     [facility] is calling for pt. He states that they received script for spiriva. Its not covered by pt insurance and they prefer incruse ellipta. Please call.

## 2017-03-11 NOTE — Telephone Encounter (Signed)
A previous call came in regarding this and it was recommended by MD that patient try Atrovent 2puff QID prn and to f/up with PCP (10/09/16)

## 2017-03-13 NOTE — Progress Notes (Signed)
Cardiology Office Note    Date:  03/15/2017   ID:  Kristopher Ayers, DOB [birth year], MRN [id number]  PCP:  Manfred Shirts, PA  Cardiologist:  Zahria Ding [place], MD    History of Present Illness:  Kristopher Ayers is a 83 y.o. male seen for evaluation of bradycardia.   He has a history of CAD and is s/p CABG in 2001. This included LIMA to the LAD, SVG to Ramus, and SVG to RCA. He did well until the end of 2016 when he developed progressive dyspnea on exertion. A stress test was abnormal and this led to a cardiac cath. Cath films reviewed from 09/18/15: Native LAD and RCA occluded. First OM occluded. Mid LCX and second OM with obstructive disease that was successfully stented. LIMA sequential to diagonal and LAD is patent. SVG to OM1 is patent. SVG to PDA is patent-somewhat aneurysmal. Focal ulcerative but nonobstructive plaque in the proximal SVG body. The LCx was stented with a 3.0 x 15 mm Xience stent and the OM with a 2.75 x 15 mm Xience stent. Echo at that time showed normal LV function with EF 50-55%. There was aortic valve sclerosis without stenosis. He has a history of remote tobacco use 50 years ago, COPD, HTN, and hyperlipidemia. Also family history of CAD.  In early June he underwent surgery on his right shoulder by Dr. Veverly Fells. Post op he was noted to have frequent PVCs with bigeminy. He was seen by my cardiology colleagues. Electrolytes were normal. Patient was asymptomatic. Troponin levels minimally elevated with flat trend. Increase metoprolol dose was mentioned but ultimately patient discharge on 25 mg daily.Was discharged to a nursing facility. Nurse  concerned about bradycardia with HR 37 but this was felt to be due to the fact that he was in bigeminy and pulse was undercounted.   On follow up today he is doing well. He has completed his Rehab and has been back home for 3 weeks.   Denies dyspnea, chest pain, palpitations or dizziness.  Echo previously done at facility by a mobile health  service- never received copy of report. Notes his shoulder pain is much better.  Past Medical History:  Diagnosis Date  . CAD (coronary artery disease)    CABG 2001; DES to Cx and OM1 09/18/15  . CKD (chronic kidney disease)   . COPD (chronic obstructive pulmonary disease) ([place])   . Diverticulosis    patient denies  . DJD (degenerative joint disease)   . Hyperlipidemia   . Hypertension   . Internal hemorrhoids    patient denies  . Kidney stone     Past Surgical History:  Procedure Laterality Date  . ANKLE FUSION    . APPENDECTOMY    . CHOLECYSTECTOMY    . COLONOSCOPY    . CORONARY ARTERY BYPASS GRAFT  2001  . INGUINAL HERNIA REPAIR    . LEG SURGERY    . LUMBAR LAMINECTOMY     x3  . REVERSE SHOULDER ARTHROPLASTY Right 01/29/2017   Procedure: REVERSE RIGHT SHOULDER ARTHROPLASTY;  Surgeon: Netta Cedars, MD;  Location: [facility];  Service: Orthopedics;  Laterality: Right;  . ROTATOR CUFF REPAIR     x 2  . TONSILLECTOMY AND ADENOIDECTOMY    . TOTAL KNEE ARTHROPLASTY     left    Current Medications: Allergies as of 03/15/2017      Reactions   Irbesartan Cough   WEAKNESS   Nsaids Rash, Other (See Comments)   RENAL INSUFFICIENCY  Amlodipine Swelling   SWELLING REACTION UNSPECIFIED    Buprenorphine Hcl    UNSPECIFIED REACTION    Diazepam    UNSPECIFIED REACTION    Hydrocodone-acetaminophen Rash   Morphine And Related Rash      Medication List       Accurate as of 03/15/17  2:11 PM. Always use your most recent med list.          acetaminophen 325 MG tablet Commonly known as:  TYLENOL Take 650 mg by mouth every 6 (six) hours as needed for moderate pain.   allopurinol 100 MG tablet Commonly known as:  ZYLOPRIM Take 100 mg by mouth daily.   aspirin 81 MG tablet Take 81 mg by mouth daily.   B-12 PO Take 1 tablet by mouth daily.   BREO ELLIPTA 100-25 MCG/INH Aepb Generic drug:  fluticasone furoate-vilanterol Inhale 1 puff into the lungs daily.     chlorthalidone 25 MG tablet Commonly known as:  HYGROTON Take 12.5 mg by mouth daily.   DULoxetine 60 MG capsule Commonly known as:  CYMBALTA Take 60 mg by mouth daily.   ferrous sulfate 300 (60 Fe) MG/5ML syrup Take 5 mLs (300 mg total) by mouth 2 (two) times daily with a meal.   fluticasone 50 MCG/ACT nasal spray Commonly known as:  FLONASE 1 spray by Each Nare route daily.   ipratropium 17 MCG/ACT inhaler Commonly known as:  ATROVENT HFA Inhale 2 puffs into the lungs every 6 (six) hours as needed for wheezing.   metoprolol succinate 25 MG 24 hr tablet Commonly known as:  TOPROL-XL Take 25 mg by mouth daily.   pantoprazole 40 MG tablet Commonly known as:  PROTONIX Take 1 tablet (40 mg total) by mouth 2 (two) times daily before a meal.   pravastatin 40 MG tablet Commonly known as:  PRAVACHOL Take 40 mg by mouth daily.   sucralfate 1 GM/10ML suspension Commonly known as:  CARAFATE Take 10 mLs (1 g total) by mouth 4 (four) times daily -  with meals and at bedtime.   traMADol 50 MG tablet Commonly known as:  ULTRAM Take 1 tablet (50 mg total) by mouth every 6 (six) hours as needed for moderate pain.   valsartan 80 MG tablet Commonly known as:  DIOVAN Take 80 mg by mouth daily.        Allergies:   Irbesartan; Nsaids; Amlodipine; Buprenorphine hcl; Diazepam; Hydrocodone-acetaminophen; and Morphine and related   Social History   Social History  . Marital status: Married    Spouse name: N/A  . Number of children: 3  . Years of education: N/A   Occupational History  . Retired [employer]   Social History Main Topics  . Smoking status: Former Research scientist (life sciences)  . Smokeless tobacco: Never Used  . Alcohol use No  . Drug use: No  . Sexual activity: Not Asked   Other Topics Concern  . None   Social History Narrative   Daily caffeine      Family History:  The patient's family history includes CAD in his brother; Congestive Heart Failure in his father.   ROS:    Please see the history of present illness.    ROS All other systems reviewed and are negative.   PHYSICAL EXAM:   VS:  BP (!) 160/50   Pulse 70   Ht 5\' 5"  (1.651 m)   Wt 205 lb (93 kg)   BMI 34.11 kg/m    GEN: Well nourished, obese, in no acute  distress. Seen in a wheelchair HEENT: normal  Neck: no JVD, carotid bruits, or masses Cardiac: RRR with bigeminal pulse; normal S1-2, no gallop or rub. Gr 2/6 systolic ejection murmur RUSB. He has 1+ pretibial edema. Respiratory:  clear to auscultation bilaterally, normal work of breathing GI: soft, nontender, nondistended, + BS MS: no deformity or atrophy. Some swelling in right arm. Skin: warm and dry, no rash Neuro:  Alert and Oriented x 3, Strength and sensation are intact Psych: euthymic mood, full affect  Wt Readings from Last 3 Encounters:  03/15/17 205 lb (93 kg)  02/11/17 209 lb 3.2 oz (94.9 kg)  01/29/17 206 lb (93.4 kg)      Studies/Labs Reviewed:   EKG:  EKG is  ordered today.  NSR with frequent PVCs in pattern of bigeminy. Rate 70. LVH with repolarization abnormality. I have personally reviewed and interpreted this study.    Recent Labs: 01/29/2017: TSH 1.269 02/03/2017: ALT 11; Magnesium 1.7 02/04/2017: BUN 29; Creatinine, Ser 1.17; Platelets 161; Potassium 4.0; Sodium 134 02/05/2017: Hemoglobin 10.0   Lipid Panel No results found for: CHOL, TRIG, HDL, CHOLHDL, VLDL, LDLCALC, LDLDIRECT  Additional studies/ records that were reviewed today include:  Records from [facility] Lipid panel in Dec. 2016 show cholesterol 149, trig- 115, HDL 45, LDL 81.  CBC and CMET normal in April 2017.   Labs dated 02/09/17: Hgb 10.2, TFTs normal. BUN 24. Creatinine 1.31. Sodium 130.    ASSESSMENT:    1. PVC (premature ventricular contraction)   2. Essential hypertension   3. Coronary artery disease of bypass graft of native heart with stable angina pectoris ([place])   4. Pure hypercholesterolemia       PLAN:  In order of problems listed above:  1.  PVCs in a pattern of bigeminy with undercounting of his HR. This was noted post op shoulder surgery and has persisted.  Electrolytes normal. Thyroid normal. I would continue current dose of Toprol XL. Further increase in beta blocker may result in more problematic lowering of effective HR. He is completely asymptomatic and I think these PVCs are benign. 2. On statin therapy with good control. 3. CAD s/p CABG 2001. S/p Stenting of the LCx and OM1 in Jan 2017 with DES. On ASA only now. No anginal symptoms. 4. Aortic stenosis. Echo in Jan 2017 did not show significant aortic stenosis and his exam is not consistent with hemodynamically significant stenosis.   Medication Adjustments/Labs and Tests Ordered: Current medicines are reviewed at length with the patient today.  Concerns regarding medicines are outlined above.  Medication changes, Labs and Tests ordered today are listed in the Patient Instructions below. Patient Instructions  Continue your current therapy  I will see you in 6 months      Signed, Keoki Mchargue Martin[place], MD  03/15/2017 2:11 PM    [facility] [address], [place], [place], [address] [phone]

## 2017-03-15 ENCOUNTER — Telehealth: Payer: Self-pay

## 2017-03-15 ENCOUNTER — Ambulatory Visit (INDEPENDENT_AMBULATORY_CARE_PROVIDER_SITE_OTHER): Payer: Medicare HMO | Admitting: Cardiology

## 2017-03-15 ENCOUNTER — Encounter: Payer: Self-pay | Admitting: Cardiology

## 2017-03-15 VITALS — BP 160/50 | HR 70 | Ht 65.0 in | Wt 205.0 lb

## 2017-03-15 DIAGNOSIS — I493 Ventricular premature depolarization: Secondary | ICD-10-CM

## 2017-03-15 DIAGNOSIS — I25708 Atherosclerosis of coronary artery bypass graft(s), unspecified, with other forms of angina pectoris: Secondary | ICD-10-CM

## 2017-03-15 DIAGNOSIS — I10 Essential (primary) hypertension: Secondary | ICD-10-CM | POA: Diagnosis not present

## 2017-03-15 DIAGNOSIS — E7800 Pure hypercholesterolemia, unspecified: Secondary | ICD-10-CM | POA: Diagnosis not present

## 2017-03-15 NOTE — Patient Instructions (Addendum)
Continue your current therapy  I will see you in 6 months.   

## 2017-03-15 NOTE — Telephone Encounter (Signed)
Spoke with patient's wife Dr.Jordan reviewed 02/10/17 echo done at [facility], advised ok.

## 2017-08-25 DIAGNOSIS — K5790 Diverticulosis of intestine, part unspecified, without perforation or abscess without bleeding: Secondary | ICD-10-CM | POA: Insufficient documentation

## 2017-08-25 DIAGNOSIS — K648 Other hemorrhoids: Secondary | ICD-10-CM | POA: Insufficient documentation

## 2017-08-25 DIAGNOSIS — M1990 Unspecified osteoarthritis, unspecified site: Secondary | ICD-10-CM | POA: Insufficient documentation

## 2017-08-25 DIAGNOSIS — N189 Chronic kidney disease, unspecified: Secondary | ICD-10-CM | POA: Insufficient documentation

## 2017-08-25 DIAGNOSIS — J449 Chronic obstructive pulmonary disease, unspecified: Secondary | ICD-10-CM | POA: Insufficient documentation

## 2017-08-25 DIAGNOSIS — N200 Calculus of kidney: Secondary | ICD-10-CM | POA: Insufficient documentation

## 2017-09-09 NOTE — Progress Notes (Signed)
Cardiology Office Note    Date:  09/14/2017   ID:  Kristopher Ayers, DOB 02/18/1926, MRN [id number]  PCP:  Manfred Shirts, PA  Cardiologist:  Aftin Lye [place], MD    History of Present Illness:  Kristopher Ayers is a 84 y.o. male seen for follow up CAD and PVCs.  He has a history of CAD and is s/p CABG in 2001. This included LIMA to the LAD, SVG to Ramus, and SVG to RCA. He did well until the end of 2016 when he developed progressive dyspnea on exertion. A stress test was abnormal and this led to a cardiac cath. Cath films reviewed from 09/18/15: Native LAD and RCA occluded. First OM occluded. Mid LCX and second OM with obstructive disease that was successfully stented. LIMA sequential to diagonal and LAD is patent. SVG to OM1 is patent. SVG to PDA is patent-somewhat aneurysmal. Focal ulcerative but nonobstructive plaque in the proximal SVG body. The LCx was stented with a 3.0 x 15 mm Xience stent and the OM with a 2.75 x 15 mm Xience stent. Echo at that time showed normal LV function with EF 50-55%. There was aortic valve sclerosis without stenosis. He has a history of remote tobacco use 50 years ago, COPD, HTN, and hyperlipidemia. Also family history of CAD.  In June 2018 he underwent surgery on his right shoulder by Dr. Veverly Fells. Post op he was noted to have frequent PVCs with bigeminy. He was seen by my cardiology colleagues. Electrolytes were normal. Patient was asymptomatic. Troponin levels minimally elevated with flat trend. Increase metoprolol dose was mentioned but ultimately patient discharge on 25 mg daily.Was discharged to a nursing facility. Nurse  concerned about bradycardia with HR 37 but this was felt to be due to the fact that he was in bigeminy and pulse was undercounted.   On follow up today he is doing well from a cardiac standpoint.   Denies dyspnea, chest pain, palpitations or dizziness.  Echo previously done at facility by a mobile health service- showed normal LV function, mild AS,  mild MR and LAE. Notes his shoulder pain is much better. His biggest complaint today is of leg weakness bilaterally that has progressed. Has a difficult time walking. Not persistent cough with mild sputum production. Denies sinus drainage. Was prescribed Breo but quit taking it due to bad taste in his mouth.   Past Medical History:  Diagnosis Date  . CAD (coronary artery disease)    CABG 2001; DES to Cx and OM1 09/18/15  . CKD (chronic kidney disease)   . COPD (chronic obstructive pulmonary disease) ([place])   . Diverticulosis    patient denies  . DJD (degenerative joint disease)   . Hyperlipidemia   . Hypertension   . Internal hemorrhoids    patient denies  . Kidney stone     Past Surgical History:  Procedure Laterality Date  . ANKLE FUSION    . APPENDECTOMY    . CHOLECYSTECTOMY    . COLONOSCOPY    . CORONARY ARTERY BYPASS GRAFT  2001  . INGUINAL HERNIA REPAIR    . LEG SURGERY    . LUMBAR LAMINECTOMY     x3  . REVERSE SHOULDER ARTHROPLASTY Right 01/29/2017   Procedure: REVERSE RIGHT SHOULDER ARTHROPLASTY;  Surgeon: Netta Cedars, MD;  Location: [facility];  Service: Orthopedics;  Laterality: Right;  . ROTATOR CUFF REPAIR     x 2  . TONSILLECTOMY AND ADENOIDECTOMY    . TOTAL KNEE ARTHROPLASTY  left    Current Medications: Allergies as of 09/14/2017      Reactions   Irbesartan Cough   WEAKNESS   Nsaids Rash, Other (See Comments)   RENAL INSUFFICIENCY   Amlodipine Swelling   SWELLING REACTION UNSPECIFIED    Buprenorphine Hcl    UNSPECIFIED REACTION    Diazepam    UNSPECIFIED REACTION    Hydrocodone-acetaminophen Rash   Morphine And Related Rash      Medication List        Accurate as of 09/14/17 12:24 PM. Always use your most recent med list.          acetaminophen 325 MG tablet Commonly known as:  TYLENOL Take 650 mg by mouth every 6 (six) hours as needed for moderate pain.   allopurinol 100 MG tablet Commonly known as:  ZYLOPRIM Take 100 mg by mouth daily.    aspirin 81 MG tablet Take 81 mg by mouth daily.   B-12 PO Take 1 tablet by mouth daily.   BREO ELLIPTA 100-25 MCG/INH Aepb Generic drug:  fluticasone furoate-vilanterol Inhale 1 puff into the lungs daily.   chlorthalidone 25 MG tablet Commonly known as:  HYGROTON Take 12.5 mg by mouth daily.   fluticasone 50 MCG/ACT nasal spray Commonly known as:  FLONASE 1 spray by Each Nare route daily.   metoprolol succinate 25 MG 24 hr tablet Commonly known as:  TOPROL-XL Take 25 mg by mouth daily.   pravastatin 40 MG tablet Commonly known as:  PRAVACHOL Take 40 mg by mouth daily.   valsartan 80 MG tablet Commonly known as:  DIOVAN Take 80 mg by mouth daily.        Allergies:   Irbesartan; Nsaids; Amlodipine; Buprenorphine hcl; Diazepam; Hydrocodone-acetaminophen; and Morphine and related   Social History   Socioeconomic History  . Marital status: Married    Spouse name: None  . Number of children: 3  . Years of education: None  . Highest education level: None  Social Needs  . Financial resource strain: None  . Food insecurity - worry: None  . Food insecurity - inability: None  . Transportation needs - medical: None  . Transportation needs - non-medical: None  Occupational History  . Occupation: Retired    Fish farm manager: Journalist, newspaper  Tobacco Use  . Smoking status: Former Research scientist (life sciences)  . Smokeless tobacco: Never Used  Substance and Sexual Activity  . Alcohol use: No  . Drug use: No  . Sexual activity: None  Other Topics Concern  . None  Social History Narrative   Daily caffeine      Family History:  The patient's family history includes CAD in his brother; Congestive Heart Failure in his father.   ROS:   Please see the history of present illness.    ROS All other systems reviewed and are negative.   PHYSICAL EXAM:   VS:  BP (!) 164/52   Pulse 63   Ht 5\' 5"  (1.651 m)   Wt 205 lb 3.2 oz (93.1 kg)   SpO2 95%   BMI 34.15 kg/m    GENERAL:  Well  appearing HEENT:  PERRL, EOMI, sclera are clear. Oropharynx is clear. NECK:  No jugular venous distention, carotid upstroke brisk and symmetric, no bruits, no thyromegaly or adenopathy LUNGS:  Clear to auscultation bilaterally CHEST:  Unremarkable HEART:  RRR,  PMI not displaced or sustained,S1 and S2 within normal limits, no S3, no S4: no clicks, no rubs, no murmurs ABD:  Soft, nontender. BS +, no  masses or bruits. No hepatomegaly, no splenomegaly EXT:  2 + pulses throughout,  tr-1+ edema SKIN:  Warm and dry.  No rashes NEURO:  Alert and oriented x 3. Cranial nerves II through XII intact. PSYCH:  Cognitively intact    Wt Readings from Last 3 Encounters:  09/14/17 205 lb 3.2 oz (93.1 kg)  03/15/17 205 lb (93 kg)  02/11/17 209 lb 3.2 oz (94.9 kg)      Studies/Labs Reviewed:   EKG:  EKG is not ordered today.      Recent Labs: 01/29/2017: TSH 1.269 02/03/2017: ALT 11; Magnesium 1.7 02/04/2017: BUN 29; Creatinine, Ser 1.17; Platelets 161; Potassium 4.0; Sodium 134 02/05/2017: Hemoglobin 10.0   Lipid Panel No results found for: CHOL, TRIG, HDL, CHOLHDL, VLDL, LDLCALC, LDLDIRECT  Additional studies/ records that were reviewed today include:  Records from [facility] Lipid panel in Dec. 2016 show cholesterol 149, trig- 115, HDL 45, LDL 81.  CBC and CMET normal in April 2017.   Labs dated 02/09/17: Hgb 10.2, TFTs normal. BUN 24. Creatinine 1.31. Sodium 130.    ASSESSMENT:    1. PVC (premature ventricular contraction)   2. Coronary artery disease of bypass graft of native heart with stable angina pectoris ([place])   3. Essential hypertension   4. Pure hypercholesterolemia   5. Cough      PLAN:  In order of problems listed above:  1.  PVCs in a pattern of bigeminy with undercounting of his HR. This was noted post op shoulder surgery and has persisted.  Electrolytes normal. Thyroid normal.He is completely asymptomatic and I think these PVCs are  benign. 2. On statin therapy with good control. 3. CAD s/p CABG 2001. S/p Stenting of the LCx and OM1 in Jan 2017 with DES. On ASA only now. No anginal symptoms. 4. Aortic stenosis. Echo showed only mild AS  5. Leg weakness. Long history of back problems. Could have spinal stenosis. Recommend follow up with Ortho.  Medication Adjustments/Labs and Tests Ordered: Current medicines are reviewed at length with the patient today.  Concerns regarding medicines are outlined above.  Medication changes, Labs and Tests ordered today are listed in the Patient Instructions below. Patient Instructions  Continue your current therapy  I will see you in 6 months    Signed, Jearlene Bridwell [place], MD  09/14/2017 12:24 PM    [facility] [address], [place], [place], [address] [phone]

## 2017-09-14 ENCOUNTER — Ambulatory Visit (INDEPENDENT_AMBULATORY_CARE_PROVIDER_SITE_OTHER): Payer: PPO | Admitting: Cardiology

## 2017-09-14 ENCOUNTER — Encounter: Payer: Self-pay | Admitting: Cardiology

## 2017-09-14 VITALS — BP 164/52 | HR 63 | Ht 65.0 in | Wt 205.2 lb

## 2017-09-14 DIAGNOSIS — I493 Ventricular premature depolarization: Secondary | ICD-10-CM | POA: Diagnosis not present

## 2017-09-14 DIAGNOSIS — R059 Cough, unspecified: Secondary | ICD-10-CM

## 2017-09-14 DIAGNOSIS — I10 Essential (primary) hypertension: Secondary | ICD-10-CM | POA: Diagnosis not present

## 2017-09-14 DIAGNOSIS — I25708 Atherosclerosis of coronary artery bypass graft(s), unspecified, with other forms of angina pectoris: Secondary | ICD-10-CM

## 2017-09-14 DIAGNOSIS — E7800 Pure hypercholesterolemia, unspecified: Secondary | ICD-10-CM

## 2017-09-14 DIAGNOSIS — R05 Cough: Secondary | ICD-10-CM | POA: Diagnosis not present

## 2017-09-14 NOTE — Patient Instructions (Signed)
Continue your current therapy  I will see you in 6 months.   

## 2017-10-22 DIAGNOSIS — N183 Chronic kidney disease, stage 3 (moderate): Secondary | ICD-10-CM | POA: Diagnosis not present

## 2017-10-22 DIAGNOSIS — M4690 Unspecified inflammatory spondylopathy, site unspecified: Secondary | ICD-10-CM | POA: Diagnosis not present

## 2017-10-22 DIAGNOSIS — R531 Weakness: Secondary | ICD-10-CM | POA: Diagnosis not present

## 2017-10-22 DIAGNOSIS — Z96619 Presence of unspecified artificial shoulder joint: Secondary | ICD-10-CM | POA: Diagnosis not present

## 2017-10-22 DIAGNOSIS — R109 Unspecified abdominal pain: Secondary | ICD-10-CM | POA: Diagnosis not present

## 2017-10-25 DIAGNOSIS — H353132 Nonexudative age-related macular degeneration, bilateral, intermediate dry stage: Secondary | ICD-10-CM | POA: Diagnosis not present

## 2017-11-19 DIAGNOSIS — N183 Chronic kidney disease, stage 3 (moderate): Secondary | ICD-10-CM | POA: Diagnosis not present

## 2017-11-19 DIAGNOSIS — R531 Weakness: Secondary | ICD-10-CM | POA: Diagnosis not present

## 2017-11-19 DIAGNOSIS — M4690 Unspecified inflammatory spondylopathy, site unspecified: Secondary | ICD-10-CM | POA: Diagnosis not present

## 2017-11-19 DIAGNOSIS — R109 Unspecified abdominal pain: Secondary | ICD-10-CM | POA: Diagnosis not present

## 2017-11-19 DIAGNOSIS — Z96619 Presence of unspecified artificial shoulder joint: Secondary | ICD-10-CM | POA: Diagnosis not present

## 2017-12-20 DIAGNOSIS — M4690 Unspecified inflammatory spondylopathy, site unspecified: Secondary | ICD-10-CM | POA: Diagnosis not present

## 2017-12-20 DIAGNOSIS — Z96619 Presence of unspecified artificial shoulder joint: Secondary | ICD-10-CM | POA: Diagnosis not present

## 2017-12-20 DIAGNOSIS — R109 Unspecified abdominal pain: Secondary | ICD-10-CM | POA: Diagnosis not present

## 2017-12-20 DIAGNOSIS — N183 Chronic kidney disease, stage 3 (moderate): Secondary | ICD-10-CM | POA: Diagnosis not present

## 2017-12-20 DIAGNOSIS — R531 Weakness: Secondary | ICD-10-CM | POA: Diagnosis not present

## 2018-01-06 DIAGNOSIS — I2510 Atherosclerotic heart disease of native coronary artery without angina pectoris: Secondary | ICD-10-CM | POA: Diagnosis not present

## 2018-01-06 DIAGNOSIS — M1009 Idiopathic gout, multiple sites: Secondary | ICD-10-CM | POA: Diagnosis not present

## 2018-01-06 DIAGNOSIS — I129 Hypertensive chronic kidney disease with stage 1 through stage 4 chronic kidney disease, or unspecified chronic kidney disease: Secondary | ICD-10-CM | POA: Diagnosis not present

## 2018-01-06 DIAGNOSIS — E7800 Pure hypercholesterolemia, unspecified: Secondary | ICD-10-CM | POA: Diagnosis not present

## 2018-01-19 DIAGNOSIS — M4690 Unspecified inflammatory spondylopathy, site unspecified: Secondary | ICD-10-CM | POA: Diagnosis not present

## 2018-01-19 DIAGNOSIS — R531 Weakness: Secondary | ICD-10-CM | POA: Diagnosis not present

## 2018-01-19 DIAGNOSIS — Z96619 Presence of unspecified artificial shoulder joint: Secondary | ICD-10-CM | POA: Diagnosis not present

## 2018-01-19 DIAGNOSIS — N183 Chronic kidney disease, stage 3 (moderate): Secondary | ICD-10-CM | POA: Diagnosis not present

## 2018-01-19 DIAGNOSIS — R109 Unspecified abdominal pain: Secondary | ICD-10-CM | POA: Diagnosis not present

## 2018-02-19 DIAGNOSIS — N183 Chronic kidney disease, stage 3 (moderate): Secondary | ICD-10-CM | POA: Diagnosis not present

## 2018-02-19 DIAGNOSIS — Z96619 Presence of unspecified artificial shoulder joint: Secondary | ICD-10-CM | POA: Diagnosis not present

## 2018-02-19 DIAGNOSIS — M4690 Unspecified inflammatory spondylopathy, site unspecified: Secondary | ICD-10-CM | POA: Diagnosis not present

## 2018-02-19 DIAGNOSIS — R531 Weakness: Secondary | ICD-10-CM | POA: Diagnosis not present

## 2018-02-19 DIAGNOSIS — R109 Unspecified abdominal pain: Secondary | ICD-10-CM | POA: Diagnosis not present

## 2018-02-21 DIAGNOSIS — H353132 Nonexudative age-related macular degeneration, bilateral, intermediate dry stage: Secondary | ICD-10-CM | POA: Diagnosis not present

## 2018-03-02 DIAGNOSIS — M1711 Unilateral primary osteoarthritis, right knee: Secondary | ICD-10-CM | POA: Diagnosis not present

## 2018-03-21 DIAGNOSIS — M4690 Unspecified inflammatory spondylopathy, site unspecified: Secondary | ICD-10-CM | POA: Diagnosis not present

## 2018-03-21 DIAGNOSIS — R109 Unspecified abdominal pain: Secondary | ICD-10-CM | POA: Diagnosis not present

## 2018-03-21 DIAGNOSIS — Z96619 Presence of unspecified artificial shoulder joint: Secondary | ICD-10-CM | POA: Diagnosis not present

## 2018-03-21 DIAGNOSIS — R531 Weakness: Secondary | ICD-10-CM | POA: Diagnosis not present

## 2018-03-21 DIAGNOSIS — N183 Chronic kidney disease, stage 3 (moderate): Secondary | ICD-10-CM | POA: Diagnosis not present

## 2018-04-07 DIAGNOSIS — I129 Hypertensive chronic kidney disease with stage 1 through stage 4 chronic kidney disease, or unspecified chronic kidney disease: Secondary | ICD-10-CM | POA: Diagnosis not present

## 2018-04-07 DIAGNOSIS — N183 Chronic kidney disease, stage 3 (moderate): Secondary | ICD-10-CM | POA: Diagnosis not present

## 2018-04-07 DIAGNOSIS — I2510 Atherosclerotic heart disease of native coronary artery without angina pectoris: Secondary | ICD-10-CM | POA: Diagnosis not present

## 2018-04-07 DIAGNOSIS — D631 Anemia in chronic kidney disease: Secondary | ICD-10-CM | POA: Diagnosis not present

## 2018-04-07 DIAGNOSIS — E7800 Pure hypercholesterolemia, unspecified: Secondary | ICD-10-CM | POA: Diagnosis not present

## 2018-04-07 DIAGNOSIS — Z87891 Personal history of nicotine dependence: Secondary | ICD-10-CM | POA: Diagnosis not present

## 2018-04-07 DIAGNOSIS — E559 Vitamin D deficiency, unspecified: Secondary | ICD-10-CM | POA: Diagnosis not present

## 2018-04-07 DIAGNOSIS — M1009 Idiopathic gout, multiple sites: Secondary | ICD-10-CM | POA: Diagnosis not present

## 2018-04-21 DIAGNOSIS — R109 Unspecified abdominal pain: Secondary | ICD-10-CM | POA: Diagnosis not present

## 2018-04-21 DIAGNOSIS — Z96619 Presence of unspecified artificial shoulder joint: Secondary | ICD-10-CM | POA: Diagnosis not present

## 2018-04-21 DIAGNOSIS — M4690 Unspecified inflammatory spondylopathy, site unspecified: Secondary | ICD-10-CM | POA: Diagnosis not present

## 2018-04-21 DIAGNOSIS — N183 Chronic kidney disease, stage 3 (moderate): Secondary | ICD-10-CM | POA: Diagnosis not present

## 2018-04-21 DIAGNOSIS — R531 Weakness: Secondary | ICD-10-CM | POA: Diagnosis not present

## 2018-05-07 NOTE — Progress Notes (Signed)
Cardiology Office Note    Date:  05/11/2018   ID:  Kristopher Ayers, DOB 15-Nov-1925, MRN [id number]  PCP:  Manfred Shirts, PA  Cardiologist:  Ivon Oelkers [place], MD    History of Present Illness:  Kristopher Ayers is a 84 y.o. male seen for follow up CAD and PVCs.  He has a history of CAD and is s/p CABG in 2001. This included LIMA to the LAD, SVG to Ramus, and SVG to RCA. He did well until the end of 2016 when he developed progressive dyspnea on exertion. A stress test was abnormal and this led to a cardiac cath. Cath films reviewed from 09/18/15: Native LAD and RCA occluded. First OM occluded. Mid LCX and second OM with obstructive disease that was successfully stented. LIMA sequential to diagonal and LAD is patent. SVG to OM1 is patent. SVG to PDA is patent-somewhat aneurysmal. Focal ulcerative but nonobstructive plaque in the proximal SVG body. The LCx was stented with a 3.0 x 15 mm Xience stent and the OM with a 2.75 x 15 mm Xience stent. Echo at that time showed normal LV function with EF 50-55%. There was aortic valve sclerosis without stenosis. He has a history of remote tobacco use 50 years ago, COPD, HTN, and hyperlipidemia. Also family history of CAD.  In June 2018 he underwent surgery on his right shoulder by Dr. Veverly Fells. Post op he was noted to have frequent PVCs with bigeminy. He was seen by my cardiology colleagues. Electrolytes were normal. Patient was asymptomatic. Troponin levels minimally elevated with flat trend. Increase metoprolol dose was mentioned but ultimately patient discharge on 25 mg daily.Was discharged to a nursing facility. Nurse  concerned about bradycardia with HR 37 but this was felt to be due to the fact that he was in bigeminy and pulse was undercounted.   Echo previously done at facility by [facility]- showed normal LV function, mild AS, mild MR and LAE.   On follow up today he is feeling well. No chest pain, dizziness, palpitations. Notes SOB if he  exerts too much. BP typically runs 903-009 systolic.   Past Medical History:  Diagnosis Date  . CAD (coronary artery disease)    CABG 2001; DES to Cx and OM1 09/18/15  . CKD (chronic kidney disease)   . COPD (chronic obstructive pulmonary disease) ([place])   . Diverticulosis    patient denies  . DJD (degenerative joint disease)   . Hyperlipidemia   . Hypertension   . Internal hemorrhoids    patient denies  . Kidney stone     Past Surgical History:  Procedure Laterality Date  . ANKLE FUSION    . APPENDECTOMY    . CHOLECYSTECTOMY    . COLONOSCOPY    . CORONARY ARTERY BYPASS GRAFT  2001  . INGUINAL HERNIA REPAIR    . LEG SURGERY    . LUMBAR LAMINECTOMY     x3  . REVERSE SHOULDER ARTHROPLASTY Right 01/29/2017   Procedure: REVERSE RIGHT SHOULDER ARTHROPLASTY;  Surgeon: Netta Cedars, MD;  Location: [facility];  Service: Orthopedics;  Laterality: Right;  . ROTATOR CUFF REPAIR     x 2  . TONSILLECTOMY AND ADENOIDECTOMY    . TOTAL KNEE ARTHROPLASTY     left    Current Medications: Allergies as of 05/11/2018      Reactions   Irbesartan Cough   WEAKNESS   Nsaids Rash, Other (See Comments)   RENAL INSUFFICIENCY   Amlodipine Swelling  SWELLING REACTION UNSPECIFIED    Buprenorphine Hcl    UNSPECIFIED REACTION    Diazepam    UNSPECIFIED REACTION    Hydrocodone-acetaminophen Rash   Morphine And Related Rash      Medication List        Accurate as of 05/11/18  4:19 PM. Always use your most recent med list.          acetaminophen 325 MG tablet Commonly known as:  TYLENOL Take 650 mg by mouth every 6 (six) hours as needed for moderate pain.   allopurinol 100 MG tablet Commonly known as:  ZYLOPRIM Take 100 mg by mouth daily.   aspirin 81 MG tablet Take 81 mg by mouth daily.   B-12 PO Take 1 tablet by mouth daily.   chlorthalidone 25 MG tablet Commonly known as:  HYGROTON Take 12.5 mg by mouth daily.   fluticasone 50 MCG/ACT nasal spray Commonly known as:  FLONASE 1  spray by Each Nare route daily.   metoprolol succinate 25 MG 24 hr tablet Commonly known as:  TOPROL-XL Take 25 mg by mouth daily.   pravastatin 40 MG tablet Commonly known as:  PRAVACHOL Take 40 mg by mouth daily.   valsartan 160 MG tablet Commonly known as:  DIOVAN Take 1 tablet (160 mg total) by mouth daily.        Allergies:   Irbesartan; Nsaids; Amlodipine; Buprenorphine hcl; Diazepam; Hydrocodone-acetaminophen; and Morphine and related   Social History   Socioeconomic History  . Marital status: Married    Spouse name: Not on file  . Number of children: 3  . Years of education: Not on file  . Highest education level: Not on file  Occupational History  . Occupation: Retired    Fish farm manager: Journalist, newspaper  Social Needs  . Financial resource strain: Not on file  . Food insecurity:    Worry: Not on file    Inability: Not on file  . Transportation needs:    Medical: Not on file    Non-medical: Not on file  Tobacco Use  . Smoking status: Former Research scientist (life sciences)  . Smokeless tobacco: Never Used  Substance and Sexual Activity  . Alcohol use: No  . Drug use: No  . Sexual activity: Not on file  Lifestyle  . Physical activity:    Days per week: Not on file    Minutes per session: Not on file  . Stress: Not on file  Relationships  . Social connections:    Talks on phone: Not on file    Gets together: Not on file    Attends religious service: Not on file    Active member of club or organization: Not on file    Attends meetings of clubs or organizations: Not on file    Relationship status: Not on file  Other Topics Concern  . Not on file  Social History Narrative   Daily caffeine      Family History:  The patient's family history includes CAD in his brother; Congestive Heart Failure in his father.   ROS:   Please see the history of present illness.    ROS All other systems reviewed and are negative.   PHYSICAL EXAM:   VS:  BP (!) 150/70 (BP Location: Right Arm, Cuff  Size: Normal)   Pulse 60   Ht 5\' 5"  (1.651 m)   Wt 192 lb (87.1 kg)   BMI 31.95 kg/m    GENERAL:  Well appearing elderly WM in NAD HEENT:  PERRL,  EOMI, sclera are clear. Oropharynx is clear. NECK:  No jugular venous distention, carotid upstroke brisk and symmetric, no bruits, no thyromegaly or adenopathy LUNGS:  Clear to auscultation bilaterally CHEST:  Unremarkable HEART:  RRR,  PMI not displaced or sustained,S1 and S2 within normal limits, no S3, no S4: no clicks, no rubs, gr 2/6 systolic murmur RUSB.  ABD:  Soft, nontender. BS +, no masses or bruits. No hepatomegaly, no splenomegaly EXT:  2 + pulses throughout, no edema, no cyanosis no clubbing SKIN:  Warm and dry.  No rashes NEURO:  Alert and oriented x 3. Cranial nerves II through XII intact. PSYCH:  Cognitively intact      Wt Readings from Last 3 Encounters:  05/11/18 192 lb (87.1 kg)  09/14/17 205 lb 3.2 oz (93.1 kg)  03/15/17 205 lb (93 kg)      Studies/Labs Reviewed:   EKG:  EKG is ordered today.  NSR with PVCs in pattern of trigeminy. LVH with repolarization abnormality. I have personally reviewed and interpreted this study.     Recent Labs: No results found for requested labs within last 8760 hours.   Lipid Panel No results found for: CHOL, TRIG, HDL, CHOLHDL, VLDL, LDLCALC, LDLDIRECT  Additional studies/ records that were reviewed today include:  Records from [facility] Lipid panel in Dec. 2016 show cholesterol 149, trig- 115, HDL 45, LDL 81.  CBC and CMET normal in April 2017.   Labs dated 02/09/17: Hgb 10.2, TFTs normal. BUN 24. Creatinine 1.31. Sodium 130.  Dated 04/07/18: BUN 46, creatinine 1.71. Other chemistries normal. CBC normal.   ASSESSMENT:    1. Essential hypertension   2. Coronary artery disease of bypass graft of native heart with stable angina pectoris ([place])   3. PVC (premature ventricular contraction)      PLAN:  In order of problems listed above:  1.   PVCs in a pattern of bigeminy with undercounting of his HR. This was noted post op shoulder surgery and has persisted.  Electrolytes normal. Thyroid normal.He is completely asymptomatic and I think these PVCs are benign. 2. On statin therapy  3. CAD s/p CABG 2001. S/p Stenting of the LCx and OM1 in Jan 2017 with DES. On ASA only now. No anginal symptoms. 4. Aortic stenosis. Echo showed only mild AS  5. HTN control is suboptimal. Will increase valsartan to 160 mg daily.  6. CKD stage 3.   Medication Adjustments/Labs and Tests Ordered: Current medicines are reviewed at length with the patient today.  Concerns regarding medicines are outlined above.  Medication changes, Labs and Tests ordered today are listed in the Patient Instructions below. Patient Instructions  Increase valsartan to 160 mg daily for blood pressure  Continue your other therapy  Follow up in 6 months    Signed, Kristopher Bautch [place], MD  05/11/2018 4:19 PM    [facility] [address], [place], [place], [address] [phone]

## 2018-05-11 ENCOUNTER — Ambulatory Visit (INDEPENDENT_AMBULATORY_CARE_PROVIDER_SITE_OTHER): Payer: PPO | Admitting: Cardiology

## 2018-05-11 ENCOUNTER — Encounter: Payer: Self-pay | Admitting: Cardiology

## 2018-05-11 VITALS — BP 150/70 | HR 60 | Ht 65.0 in | Wt 192.0 lb

## 2018-05-11 DIAGNOSIS — I493 Ventricular premature depolarization: Secondary | ICD-10-CM | POA: Diagnosis not present

## 2018-05-11 DIAGNOSIS — I25708 Atherosclerosis of coronary artery bypass graft(s), unspecified, with other forms of angina pectoris: Secondary | ICD-10-CM

## 2018-05-11 DIAGNOSIS — I10 Essential (primary) hypertension: Secondary | ICD-10-CM | POA: Diagnosis not present

## 2018-05-11 MED ORDER — VALSARTAN 160 MG PO TABS: 160.0000 mg | ORAL_TABLET | Freq: Every day | ORAL | 3 refills | Status: DC

## 2018-05-11 NOTE — Patient Instructions (Addendum)
Increase valsartan to 160 mg daily for blood pressure  Continue your other therapy  Follow up in 6 months

## 2018-07-01 DIAGNOSIS — M1009 Idiopathic gout, multiple sites: Secondary | ICD-10-CM | POA: Diagnosis not present

## 2018-07-01 DIAGNOSIS — Z0000 Encounter for general adult medical examination without abnormal findings: Secondary | ICD-10-CM | POA: Diagnosis not present

## 2018-07-01 DIAGNOSIS — Z79899 Other long term (current) drug therapy: Secondary | ICD-10-CM | POA: Diagnosis not present

## 2018-07-01 DIAGNOSIS — N183 Chronic kidney disease, stage 3 (moderate): Secondary | ICD-10-CM | POA: Diagnosis not present

## 2018-07-01 DIAGNOSIS — I129 Hypertensive chronic kidney disease with stage 1 through stage 4 chronic kidney disease, or unspecified chronic kidney disease: Secondary | ICD-10-CM | POA: Diagnosis not present

## 2018-07-01 DIAGNOSIS — D631 Anemia in chronic kidney disease: Secondary | ICD-10-CM | POA: Diagnosis not present

## 2018-07-01 DIAGNOSIS — E7800 Pure hypercholesterolemia, unspecified: Secondary | ICD-10-CM | POA: Diagnosis not present

## 2018-07-01 DIAGNOSIS — I2510 Atherosclerotic heart disease of native coronary artery without angina pectoris: Secondary | ICD-10-CM | POA: Diagnosis not present

## 2018-07-01 DIAGNOSIS — Z7982 Long term (current) use of aspirin: Secondary | ICD-10-CM | POA: Diagnosis not present

## 2018-07-01 DIAGNOSIS — Z951 Presence of aortocoronary bypass graft: Secondary | ICD-10-CM | POA: Diagnosis not present

## 2018-07-01 DIAGNOSIS — Z87891 Personal history of nicotine dependence: Secondary | ICD-10-CM | POA: Diagnosis not present

## 2018-07-20 DIAGNOSIS — M1711 Unilateral primary osteoarthritis, right knee: Secondary | ICD-10-CM | POA: Diagnosis not present

## 2018-11-12 NOTE — Progress Notes (Deleted)
Cardiology Office Note    Date:  11/12/2018   ID:  Kristopher Ayers, DOB August 08, 1926, MRN [id number]  PCP:  Manfred Shirts, PA  Cardiologist:  Mohamed Portlock [place], MD    History of Present Illness:  Kristopher Ayers is a 85 y.o. male seen for follow up CAD and PVCs.  He has a history of CAD and is s/p CABG in 2001. This included LIMA to the LAD, SVG to Ramus, and SVG to RCA. He did well until the end of 2016 when he developed progressive dyspnea on exertion. A stress test was abnormal and this led to a cardiac cath. Cath films reviewed from 09/18/15: Native LAD and RCA occluded. First OM occluded. Mid LCX and second OM with obstructive disease that was successfully stented. LIMA sequential to diagonal and LAD is patent. SVG to OM1 is patent. SVG to PDA is patent-somewhat aneurysmal. Focal ulcerative but nonobstructive plaque in the proximal SVG body. The LCx was stented with a 3.0 x 15 mm Xience stent and the OM with a 2.75 x 15 mm Xience stent. Echo at that time showed normal LV function with EF 50-55%. There was aortic valve sclerosis without stenosis. He has a history of remote tobacco use 50 years ago, COPD, HTN, and hyperlipidemia. Also family history of CAD.  In June 2018 he underwent surgery on his right shoulder by Dr. Veverly Fells. Post op he was noted to have frequent PVCs with bigeminy. He was seen by my cardiology colleagues. Electrolytes were normal. Patient was asymptomatic. Troponin levels minimally elevated with flat trend. Increase metoprolol dose was mentioned but ultimately patient discharge on 25 mg daily.Was discharged to a nursing facility. Nurse  concerned about bradycardia with HR 37 but this was felt to be due to the fact that he was in bigeminy and pulse was undercounted.   Echo previously done at facility by a mobile health service- showed normal LV function, mild AS, mild MR and LAE.   On follow up today he is feeling well. No chest pain, dizziness, palpitations. Notes SOB if he  exerts too much. BP typically runs 413-244 systolic.   Past Medical History:  Diagnosis Date  . CAD (coronary artery disease)    CABG 2001; DES to Cx and OM1 09/18/15  . CKD (chronic kidney disease)   . COPD (chronic obstructive pulmonary disease) ([place])   . Diverticulosis    patient denies  . DJD (degenerative joint disease)   . Hyperlipidemia   . Hypertension   . Internal hemorrhoids    patient denies  . Kidney stone     Past Surgical History:  Procedure Laterality Date  . ANKLE FUSION    . APPENDECTOMY    . CHOLECYSTECTOMY    . COLONOSCOPY    . CORONARY ARTERY BYPASS GRAFT  2001  . INGUINAL HERNIA REPAIR    . LEG SURGERY    . LUMBAR LAMINECTOMY     x3  . REVERSE SHOULDER ARTHROPLASTY Right 01/29/2017   Procedure: REVERSE RIGHT SHOULDER ARTHROPLASTY;  Surgeon: Netta Cedars, MD;  Location: [facility];  Service: Orthopedics;  Laterality: Right;  . ROTATOR CUFF REPAIR     x 2  . TONSILLECTOMY AND ADENOIDECTOMY    . TOTAL KNEE ARTHROPLASTY     left    Current Medications: Allergies as of 11/17/2018      Reactions   Irbesartan Cough   WEAKNESS   Nsaids Rash, Other (See Comments)   RENAL INSUFFICIENCY   Amlodipine Swelling  SWELLING REACTION UNSPECIFIED    Buprenorphine Hcl    UNSPECIFIED REACTION    Diazepam    UNSPECIFIED REACTION    Hydrocodone-acetaminophen Rash   Morphine And Related Rash      Medication List       Accurate as of November 12, 2018  8:43 PM. Always use your most recent med list.        acetaminophen 325 MG tablet Commonly known as:  TYLENOL Take 650 mg by mouth every 6 (six) hours as needed for moderate pain.   allopurinol 100 MG tablet Commonly known as:  ZYLOPRIM Take 100 mg by mouth daily.   aspirin 81 MG tablet Take 81 mg by mouth daily.   B-12 PO Take 1 tablet by mouth daily.   chlorthalidone 25 MG tablet Commonly known as:  HYGROTON Take 12.5 mg by mouth daily.   fluticasone 50 MCG/ACT nasal spray Commonly known as:  FLONASE  1 spray by Each Nare route daily.   metoprolol succinate 25 MG 24 hr tablet Commonly known as:  TOPROL-XL Take 25 mg by mouth daily.   pravastatin 40 MG tablet Commonly known as:  PRAVACHOL Take 40 mg by mouth daily.   valsartan 160 MG tablet Commonly known as:  Diovan Take 1 tablet (160 mg total) by mouth daily.        Allergies:   Irbesartan; Nsaids; Amlodipine; Buprenorphine hcl; Diazepam; Hydrocodone-acetaminophen; and Morphine and related   Social History   Socioeconomic History  . Marital status: Married    Spouse name: Not on file  . Number of children: 3  . Years of education: Not on file  . Highest education level: Not on file  Occupational History  . Occupation: Retired    Fish farm manager: Journalist, newspaper  Social Needs  . Financial resource strain: Not on file  . Food insecurity:    Worry: Not on file    Inability: Not on file  . Transportation needs:    Medical: Not on file    Non-medical: Not on file  Tobacco Use  . Smoking status: Former Research scientist (life sciences)  . Smokeless tobacco: Never Used  Substance and Sexual Activity  . Alcohol use: No  . Drug use: No  . Sexual activity: Not on file  Lifestyle  . Physical activity:    Days per week: Not on file    Minutes per session: Not on file  . Stress: Not on file  Relationships  . Social connections:    Talks on phone: Not on file    Gets together: Not on file    Attends religious service: Not on file    Active member of club or organization: Not on file    Attends meetings of clubs or organizations: Not on file    Relationship status: Not on file  Other Topics Concern  . Not on file  Social History Narrative   Daily caffeine      Family History:  The patient's family history includes CAD in his brother; Congestive Heart Failure in his father.   ROS:   Please see the history of present illness.    ROS All other systems reviewed and are negative.   PHYSICAL EXAM:   VS:  There were no vitals taken for this  visit.   GENERAL:  Well appearing elderly WM in NAD HEENT:  PERRL, EOMI, sclera are clear. Oropharynx is clear. NECK:  No jugular venous distention, carotid upstroke brisk and symmetric, no bruits, no thyromegaly or adenopathy LUNGS:  Clear  to auscultation bilaterally CHEST:  Unremarkable HEART:  RRR,  PMI not displaced or sustained,S1 and S2 within normal limits, no S3, no S4: no clicks, no rubs, gr 2/6 systolic murmur RUSB.  ABD:  Soft, nontender. BS +, no masses or bruits. No hepatomegaly, no splenomegaly EXT:  2 + pulses throughout, no edema, no cyanosis no clubbing SKIN:  Warm and dry.  No rashes NEURO:  Alert and oriented x 3. Cranial nerves II through XII intact. PSYCH:  Cognitively intact      Wt Readings from Last 3 Encounters:  05/11/18 192 lb (87.1 kg)  09/14/17 205 lb 3.2 oz (93.1 kg)  03/15/17 205 lb (93 kg)      Studies/Labs Reviewed:   EKG:  EKG is ordered today.  NSR with PVCs in pattern of trigeminy. LVH with repolarization abnormality. I have personally reviewed and interpreted this study.     Recent Labs: No results found for requested labs within last 8760 hours.   Lipid Panel No results found for: CHOL, TRIG, HDL, CHOLHDL, VLDL, LDLCALC, LDLDIRECT  Additional studies/ records that were reviewed today include:  Records from [facility] Lipid panel in Dec. 2016 show cholesterol 149, trig- 115, HDL 45, LDL 81.  CBC and CMET normal in April 2017.   Labs dated 02/09/17: Hgb 10.2, TFTs normal. BUN 24. Creatinine 1.31. Sodium 130.  Dated 04/07/18: BUN 46, creatinine 1.71. Other chemistries normal. CBC normal.   ASSESSMENT:    No diagnosis found.   PLAN:  In order of problems listed above:  1.  PVCs in a pattern of bigeminy with undercounting of his HR. This was noted post op shoulder surgery and has persisted.  Electrolytes normal. Thyroid normal.He is completely asymptomatic and I think these PVCs are benign. 2. On statin  therapy  3. CAD s/p CABG 2001. S/p Stenting of the LCx and OM1 in Jan 2017 with DES. On ASA only now. No anginal symptoms. 4. Aortic stenosis. Echo showed only mild AS  5. HTN control is suboptimal. Will increase valsartan to 160 mg daily.  6. CKD stage 3.   Medication Adjustments/Labs and Tests Ordered: Current medicines are reviewed at length with the patient today.  Concerns regarding medicines are outlined above.  Medication changes, Labs and Tests ordered today are listed in the Patient Instructions below. There are no Patient Instructions on file for this visit.   Signed, Ron Beske [place], MD  11/12/2018 8:43 PM    [facility] [address], [place], [place], [address] [phone]

## 2018-11-17 ENCOUNTER — Ambulatory Visit: Payer: PPO | Admitting: Cardiology

## 2019-02-03 ENCOUNTER — Telehealth: Payer: Self-pay | Admitting: Cardiology

## 2019-02-03 NOTE — Telephone Encounter (Signed)
No mychart, no smartphone, consent, pre reg complete 02/03/19 AF

## 2019-02-07 NOTE — Progress Notes (Signed)
Virtual Visit via Telephone Note   This visit type was conducted due to national recommendations for restrictions regarding the COVID-19 Pandemic (e.g. social distancing) in an effort to limit this patient's exposure and mitigate transmission in our community.  Due to his co-morbid illnesses, this patient is at least at moderate risk for complications without adequate follow up.  This format is felt to be most appropriate for this patient at this time.  The patient did not have access to video technology/had technical difficulties with video requiring transitioning to audio format only (telephone).  All issues noted in this document were discussed and addressed.  No physical exam could be performed with this format.  Please refer to the patient's chart for his  consent to telehealth for [facility].   Date:  02/07/2019   ID:  Kristopher Ayers, DOB 03/09/1926, MRN [id number]  Patient Location: Home Provider Location: Home  PCP:  Manfred Shirts, PA  Cardiologist:  Jezelle Gullick Martinique, MD  Electrophysiologist:  None   Evaluation Performed:  Follow-Up Visit  Chief Complaint:  Follow up CAD  History of Present Illness:    Kristopher Ayers is a 85 y.o. male with  a history of CAD and is s/p CABG in 2001. This included LIMA to the LAD, SVG to Ramus, and SVG to RCA. He did well until the end of 2016 when he developed progressive dyspnea on exertion. A stress test was abnormal and this led to a cardiac cath. Cath films reviewed from 09/18/15: Native LAD and RCA occluded. First OM occluded. Mid LCX and second OM with obstructive disease that was successfully stented. LIMA sequential to diagonal and LAD is patent. SVG to OM1 is patent. SVG to PDA is patent-somewhat aneurysmal. Focal ulcerative but nonobstructive plaque in the proximal SVG body. The LCx was stented with a 3.0 x 15 mm Xience stent and the OM with a 2.75 x 15 mm Xience stent. Echo at that time showed normal LV function with EF 50-55%. There was  aortic valve sclerosis without stenosis. He has a history of remote tobacco use 50 years ago, COPD, HTN, and hyperlipidemia. Also family history of CAD.  In June 2018 he underwent surgery on his right shoulder by Dr. Veverly Fells. Post op he was noted to have frequent PVCs with bigeminy. He was seen by my cardiology colleagues. Electrolytes were normal. Patient was asymptomatic. Troponin levels minimally elevated with flat trend. Increase metoprolol dose was mentioned but ultimately patient discharge on 25 mg daily. Was discharged to a nursing facility. Nurse  concerned about bradycardia with HR 37 but this was felt to be due to the fact that he was in bigeminy and pulse was undercounted.   Echo previously done at facility by a mobile health service showed normal LV function, mild AS, mild MR and LAE.   We spoke today by phone with his wife as well. He notes some SOB and leg weakness with activity. Some recent gout flair last month. No angina. No edema or palpitations. Hasn't been able to check his BP   The patient does not have symptoms concerning for COVID-19 infection (fever, chills, cough, or new shortness of breath).    Past Medical History:  Diagnosis Date  . CAD (coronary artery disease)    CABG 2001; DES to Cx and OM1 09/18/15  . CKD (chronic kidney disease)   . COPD (chronic obstructive pulmonary disease) ([place])   . Diverticulosis    patient denies  . DJD (degenerative joint disease)   .  Hyperlipidemia   . Hypertension   . Internal hemorrhoids    patient denies  . Kidney stone    Past Surgical History:  Procedure Laterality Date  . ANKLE FUSION    . APPENDECTOMY    . CHOLECYSTECTOMY    . COLONOSCOPY    . CORONARY ARTERY BYPASS GRAFT  2001  . INGUINAL HERNIA REPAIR    . LEG SURGERY    . LUMBAR LAMINECTOMY     x3  . REVERSE SHOULDER ARTHROPLASTY Right 01/29/2017   Procedure: REVERSE RIGHT SHOULDER ARTHROPLASTY;  Surgeon: Netta Cedars, MD;  Location: [facility];  Service: Orthopedics;   Laterality: Right;  . ROTATOR CUFF REPAIR     x 2  . TONSILLECTOMY AND ADENOIDECTOMY    . TOTAL KNEE ARTHROPLASTY     left     No outpatient medications have been marked as taking for the 02/10/19 encounter (Appointment) with Martinique, Cola Highfill M, MD.     Allergies:   Irbesartan; Nsaids; Amlodipine; Buprenorphine hcl; Diazepam; Hydrocodone-acetaminophen; and Morphine and related   Social History   Tobacco Use  . Smoking status: Former Research scientist (life sciences)  . Smokeless tobacco: Never Used  Substance Use Topics  . Alcohol use: No  . Drug use: No     Family Hx: The patient's family history includes CAD in his brother; Congestive Heart Failure in his father. There is no history of Colon cancer.  ROS:   Please see the history of present illness.    All other systems reviewed and are negative.   Prior CV studies:   The following studies were reviewed today:  none  Labs/Other Tests and Data Reviewed:    EKG:  No ECG reviewed.  Recent Labs: No results found for requested labs within last 8760 hours.   Recent Lipid Panel No results found for: CHOL, TRIG, HDL, CHOLHDL, LDLCALC, LDLDIRECT  Wt Readings from Last 3 Encounters:  05/11/18 192 lb (87.1 kg)  09/14/17 205 lb 3.2 oz (93.1 kg)  03/15/17 205 lb (93 kg)     Objective:    Vital Signs:  There were no vitals taken for this visit.   VITAL SIGNS:  reviewed  ASSESSMENT & PLAN:    1.  PVCs  With history of  bigeminy with undercounting of his HR. Marland KitchenHe is completely asymptomatic and I think these PVCs are benign. 2. Hypercholesterolemia. On statin therapy- not sure when last lab work was done.  3. CAD s/p CABG 2001. S/p Stenting of the LCx and OM1 in Jan 2017 with DES. On ASA only now. No anginal symptoms. 4. Aortic stenosis. Echo showed only mild AS  5. HTN unclear what control is.  6.   CKD stage 3.   COVID-19 Education: The signs and symptoms of COVID-19 were discussed with the patient and how to seek care for testing (follow up  with PCP or arrange E-visit).  The importance of social distancing was discussed today.  Time:   Today, I have spent 10 minutes with the patient with telehealth technology discussing the above problems.     Medication Adjustments/Labs and Tests Ordered: Current medicines are reviewed at length with the patient today.  Concerns regarding medicines are outlined above.   Tests Ordered: No orders of the defined types were placed in this encounter.   Medication Changes: No orders of the defined types were placed in this encounter.   Disposition:  Follow up in 6 month(s), if primary care hasn't done lab work by then we will.   Signed,  Abbygail Willhoite Martinique, MD  02/07/2019 1:39 PM    [facility]

## 2019-02-10 ENCOUNTER — Telehealth (INDEPENDENT_AMBULATORY_CARE_PROVIDER_SITE_OTHER): Payer: Medicare Other | Admitting: Cardiology

## 2019-02-10 ENCOUNTER — Encounter: Payer: Self-pay | Admitting: Cardiology

## 2019-02-10 DIAGNOSIS — I10 Essential (primary) hypertension: Secondary | ICD-10-CM

## 2019-02-10 DIAGNOSIS — I493 Ventricular premature depolarization: Secondary | ICD-10-CM

## 2019-02-10 DIAGNOSIS — I25708 Atherosclerosis of coronary artery bypass graft(s), unspecified, with other forms of angina pectoris: Secondary | ICD-10-CM | POA: Diagnosis not present

## 2019-02-10 DIAGNOSIS — E7800 Pure hypercholesterolemia, unspecified: Secondary | ICD-10-CM

## 2019-02-27 ENCOUNTER — Other Ambulatory Visit: Payer: Self-pay

## 2019-03-06 ENCOUNTER — Telehealth: Payer: Self-pay

## 2019-03-06 MED ORDER — CANDESARTAN CILEXETIL 8 MG PO TABS: 8.0000 mg | ORAL_TABLET | Freq: Every day | ORAL | 3 refills | Status: DC

## 2019-03-06 NOTE — Telephone Encounter (Signed)
Spoke to patient's wife.Advised we received a fax today from [facility] on back order.Dr.Jordan stopped Valsartan and started Candesartan 8 mg daily.Prescription sent to pharmacy.

## 2019-05-03 ENCOUNTER — Telehealth: Payer: Self-pay | Admitting: Cardiology

## 2019-05-03 NOTE — Telephone Encounter (Signed)
LMTCB for [facility].

## 2019-05-03 NOTE — Telephone Encounter (Signed)
New Message:     Pt was put on Canesartan in July- his blood pressure is        unning high. Does pt pt need to be seen before December?

## 2019-05-04 NOTE — Telephone Encounter (Signed)
Called Melinda back to discuss-  LVM with call back number.

## 2019-05-04 NOTE — Telephone Encounter (Signed)
Changed blood pressure visit in July- 197/85 previous visit on August 21st 189/85 last visit on August  Patient was on Valsartan 80 mg- but was unable to get it anymore, it changed to Candesartan and Metoprolol 25 mg.   Patient has not been checking at home- daughter would like to know if patient should be seen sooner than the appointment in December.   Patient daughter advised to have him to start checking his blood pressure at home and keep a record of it and to call back in a few days to let us know so we can make adjustments as needed. Advised I would route a message to primary nurse to make her aware.

## 2019-05-04 NOTE — Telephone Encounter (Signed)
Spoke to patient's daughter Rip Harbour she stated father's B/P this afternoon 123/72.She will monitor B/P and call back if elevated.Appointment with Dr.Jordan rescheduled to 11/9 at 3:20 pm.

## 2019-05-04 NOTE — Telephone Encounter (Signed)
Follow up ° ° °Patient's daughter is returning your call. Please call. °

## 2019-06-02 IMAGING — CR DG ABD PORTABLE 1V
1 series · 1 of 1 positions shown · non-contrast
Comparison: CT abdomen and pelvis 10/08/2009

CLINICAL DATA: Abdominal pain and distention, difficulty swallowing
foods occurring the family, history hypertension, hyperlipidemia,
kidney stones, COPD, chronic kidney disease, coronary artery
disease, colonic diverticulosis

EXAM:
PORTABLE ABDOMEN - 1 VIEW

[AP]
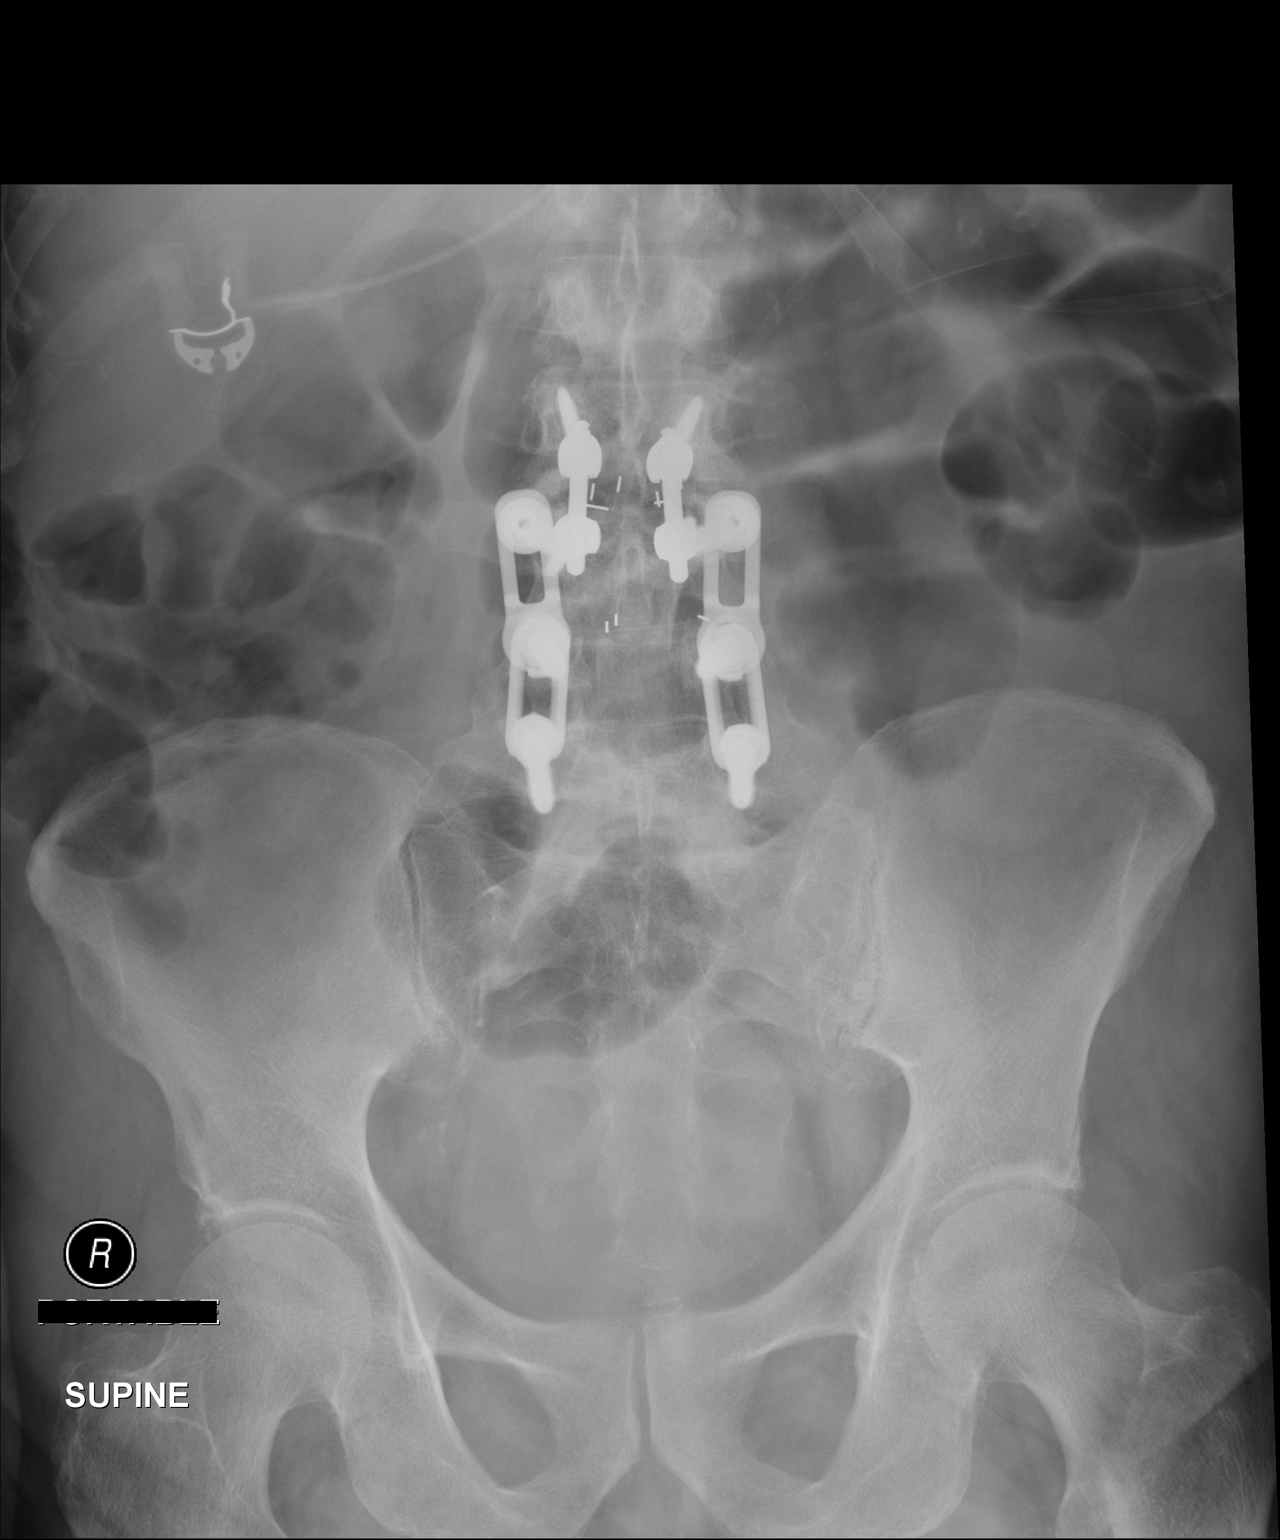

[1 of 1 positions shown; findings below may reference images not displayed]

FINDINGS: Scattered gas throughout air-filled nondistended colon.

Small bowel gas pattern normal.

No bowel dilatation or bowel wall thickening.

Bones appear slightly demineralized with evidence of prior lumbar
fusion.

No definite urinary tract calcification.
IMPRESSION: Nonspecific bowel gas pattern.

## 2019-06-04 IMAGING — DX DG CHEST 1V PORT
1 series · 1 of 1 positions shown · non-contrast
Comparison: 12/16/2015

CLINICAL DATA: Lethargy

EXAM:
PORTABLE CHEST 1 VIEW

[chest]
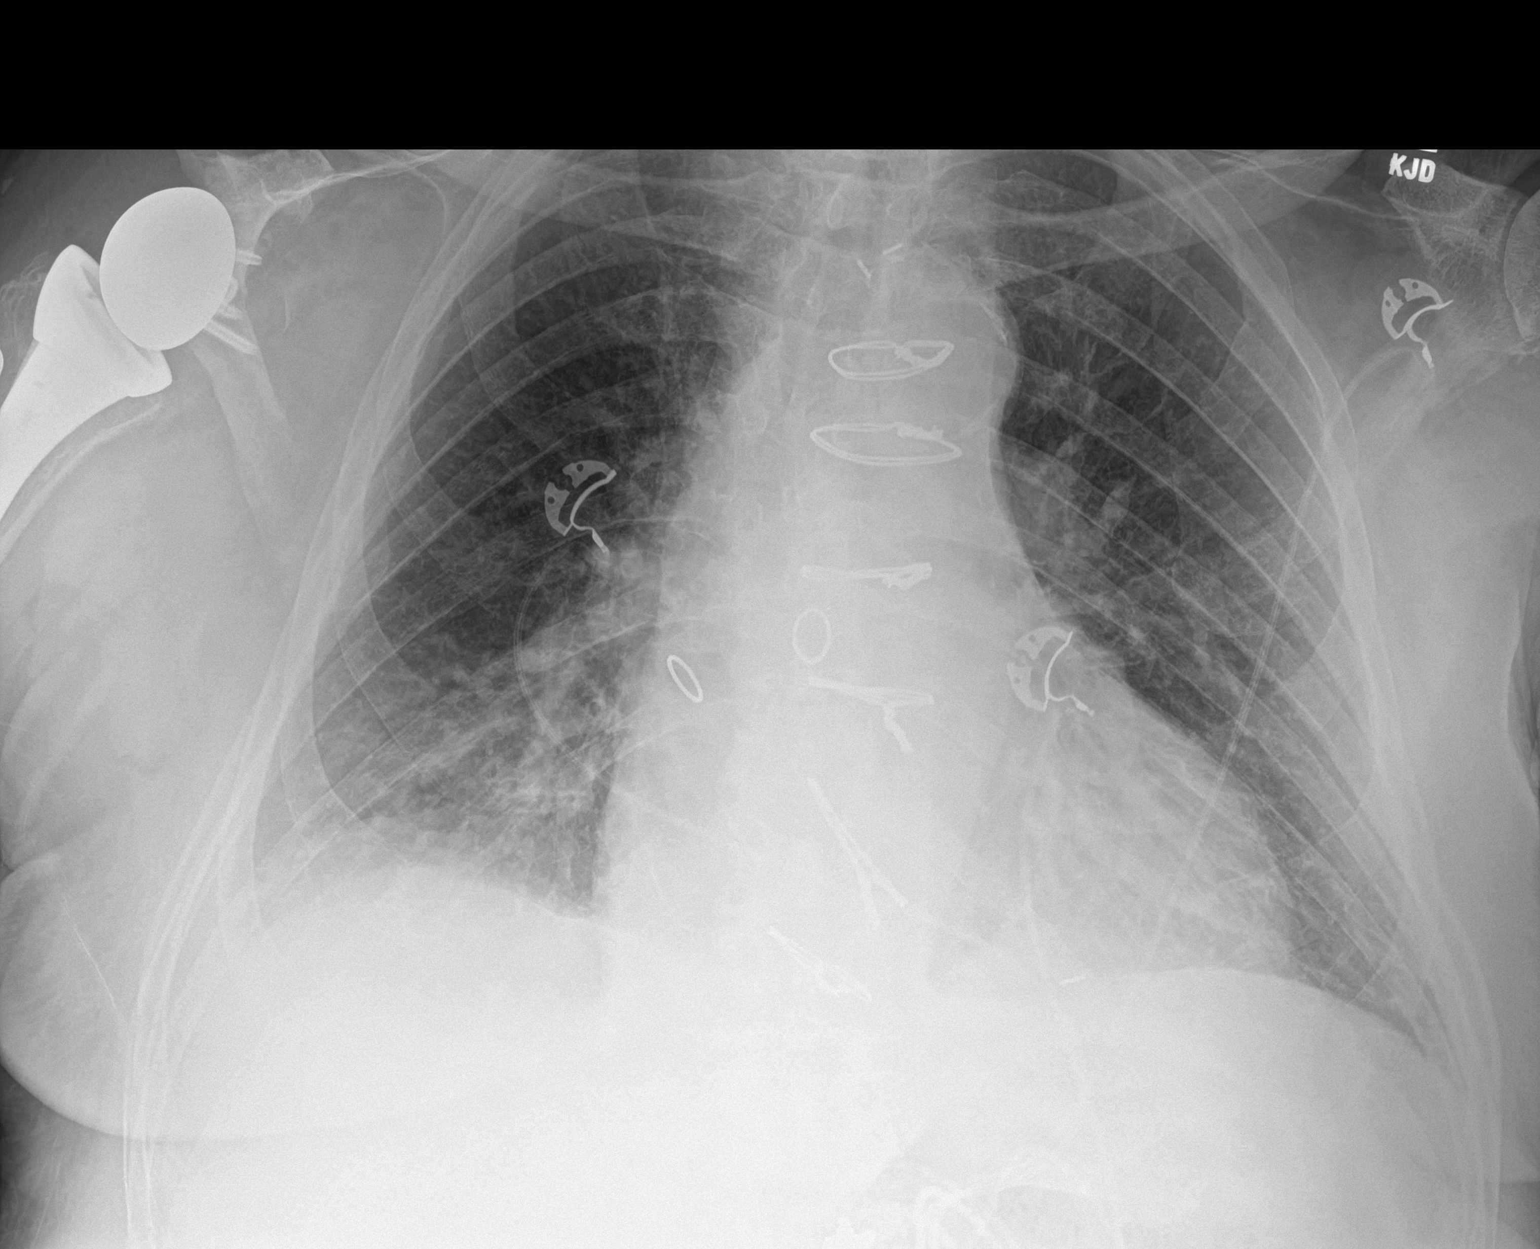

[1 of 1 positions shown; findings below may reference images not displayed]

FINDINGS: Post sternotomy changes. Cardiomegaly with central vascular
congestion. Increased opacity at the right base with probable
adjacent small effusion. Aortic atherosclerosis. No pneumothorax.
IMPRESSION: 1. Hazy infiltrate at the right base with small right pleural
effusion.
2. Cardiomegaly with central vascular congestion.

## 2019-06-04 IMAGING — CT CT HEAD W/O CM
4 series · 17 of 47 positions shown, 19 images · non-contrast
Comparison: None.

CLINICAL DATA: Altered mental status

EXAM:
CT HEAD WITHOUT CONTRAST
TECHNIQUE: Contiguous axial images were obtained from the base of the skull
through the vertex without intravenous contrast.

[Series 3: head without · axial · non-contrast · 0.44mm/px · z∈[-97,+33]mm · 7 of 36 slices shown, 9 images]
[im 5/36  brain]
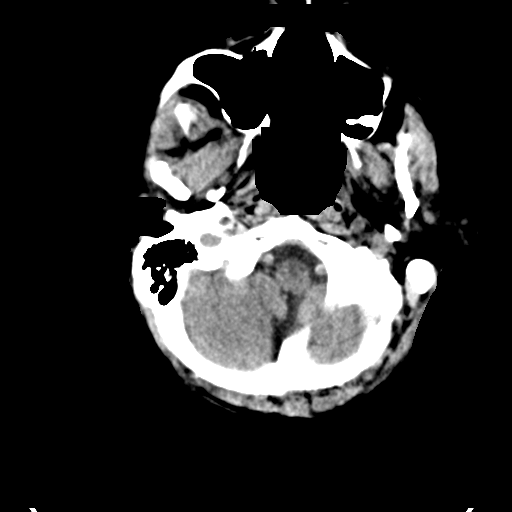
[im 5/36  bone]
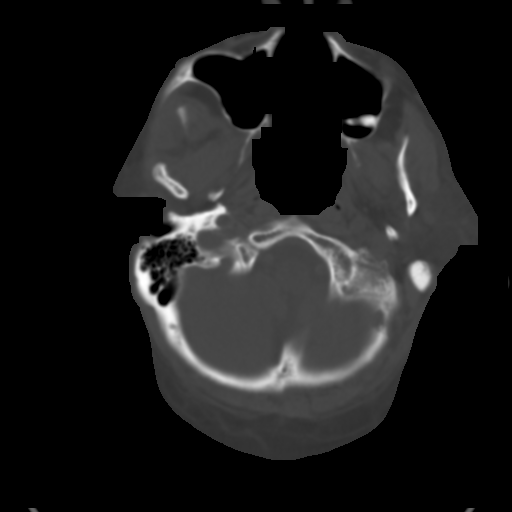
[im 9/36  brain]
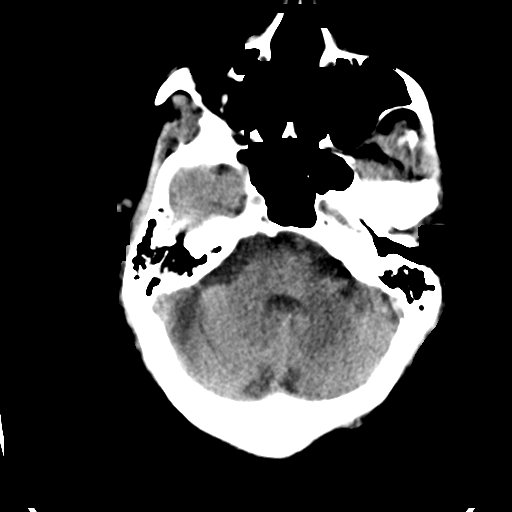
[im 14/36  brain]
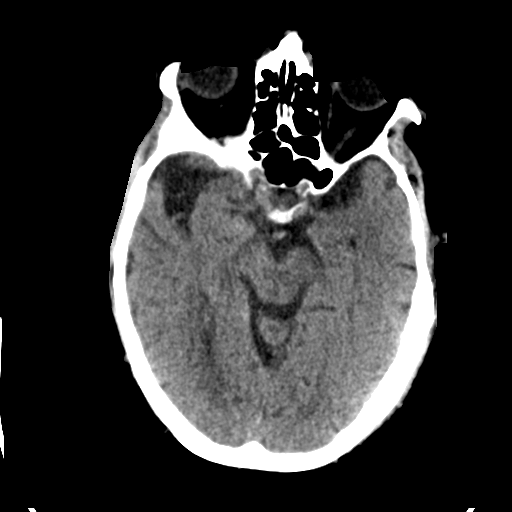
[im 18/36  brain]
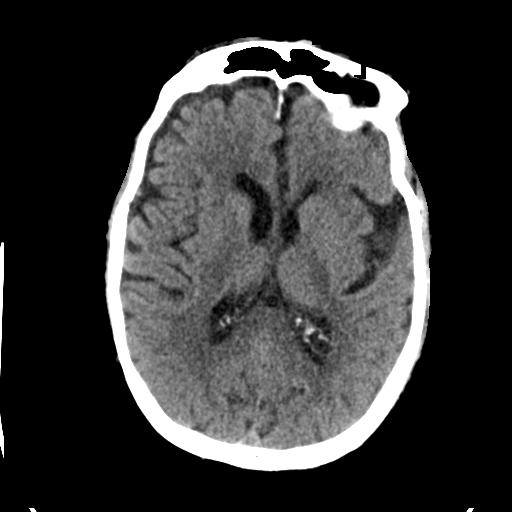
[im 22/36  brain]
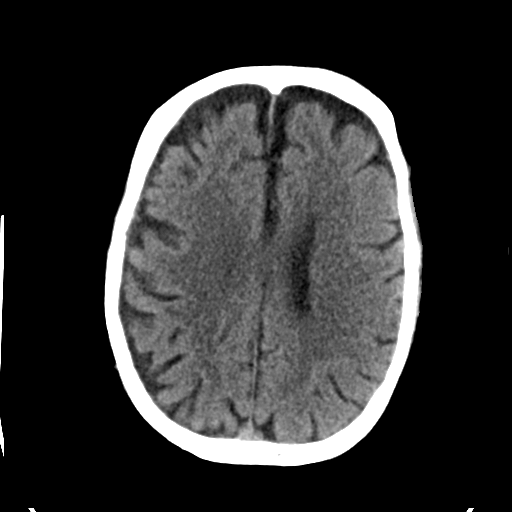
[im 22/36  bone]
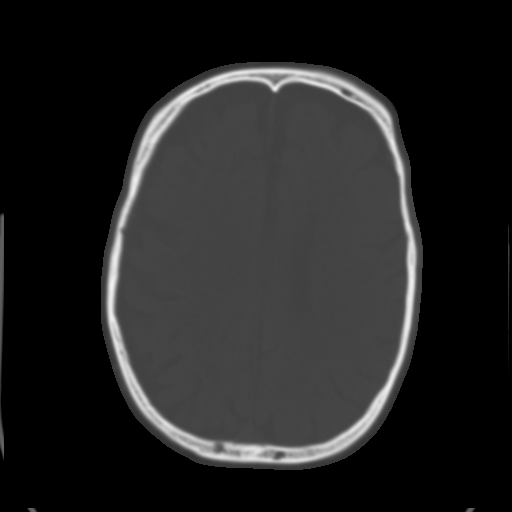
[im 27/36  brain]
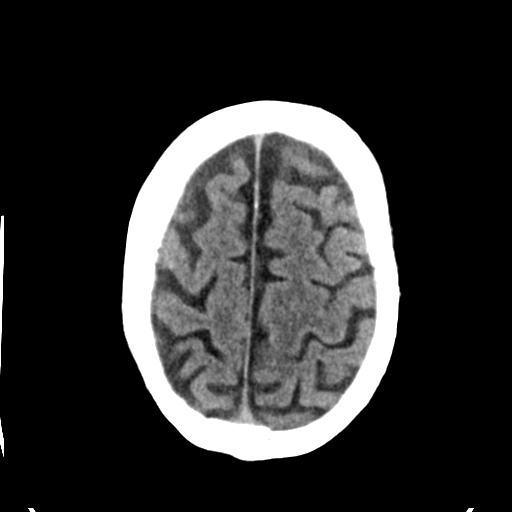
[im 31/36  brain]
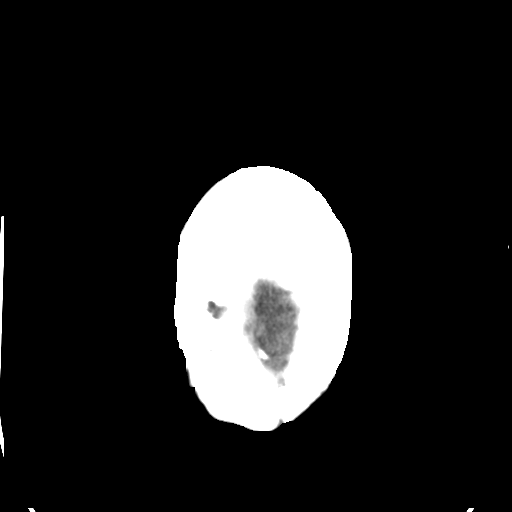

[Series 4: head bone · axial · 0.44mm/px · z∈[-101,-37]mm · 4 of 90 slices shown]
[im 9/90  bone]
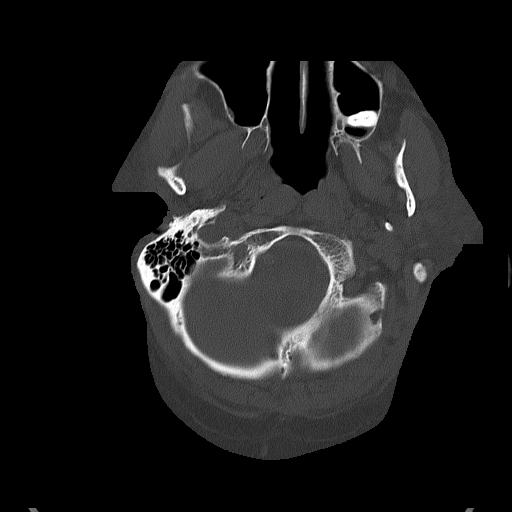
[im 18/90  bone]
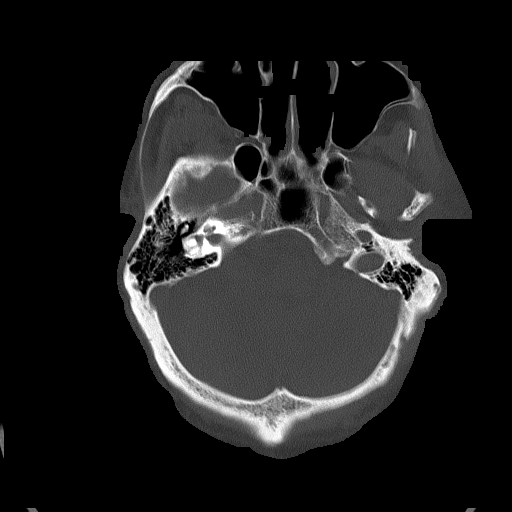
[im 27/90  bone]
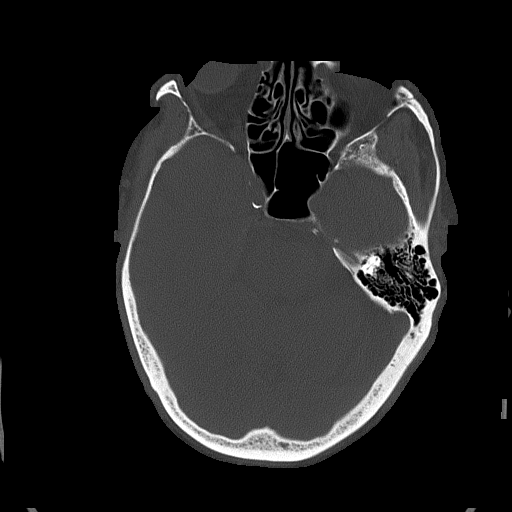
[im 41/90  bone]
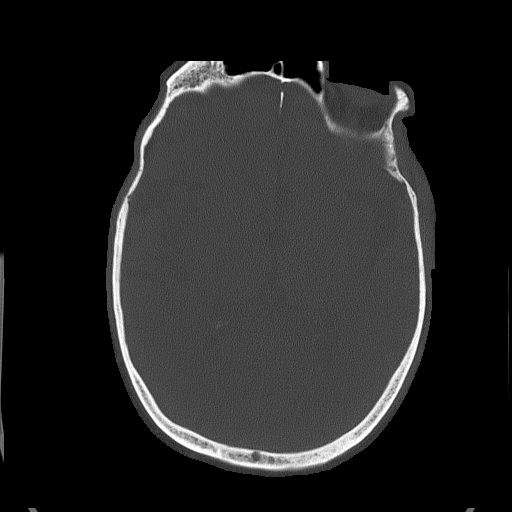

[Series 5: head without cor · coronal · non-contrast · 0.32mm/px · 3 of 69 slices shown]
[im 23/69  brain]
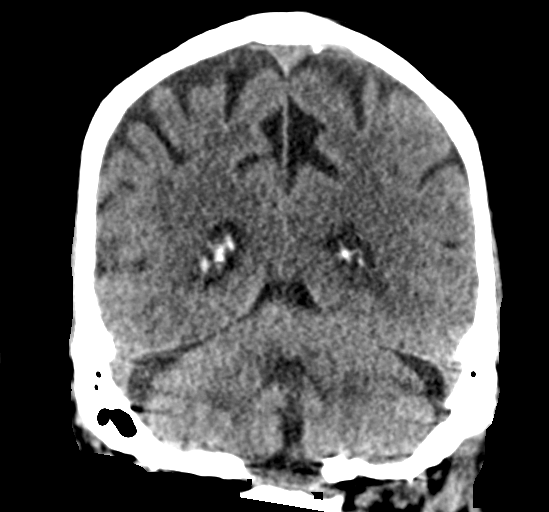
[im 31/69  brain]
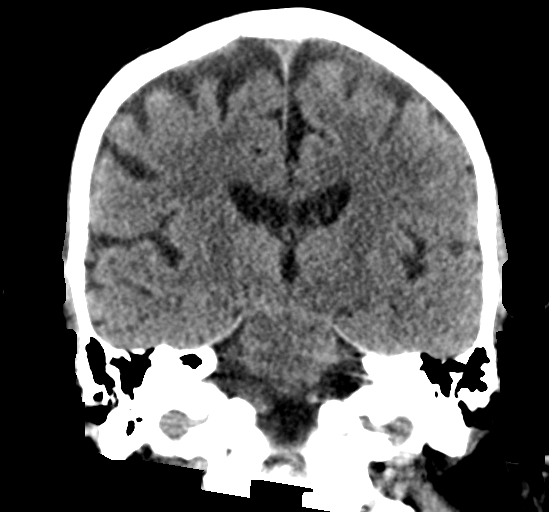
[im 38/69  brain]
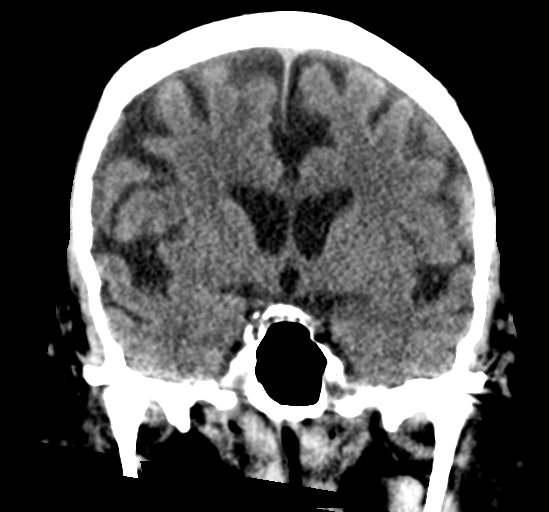

[Series 6: head without sag · sagittal · non-contrast · 0.33mm/px · 3 of 57 slices shown]
[im 19/57  brain]
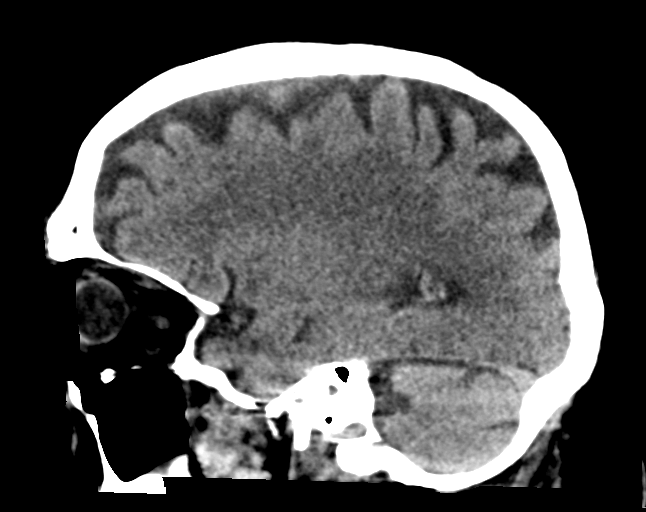
[im 29/57  brain]
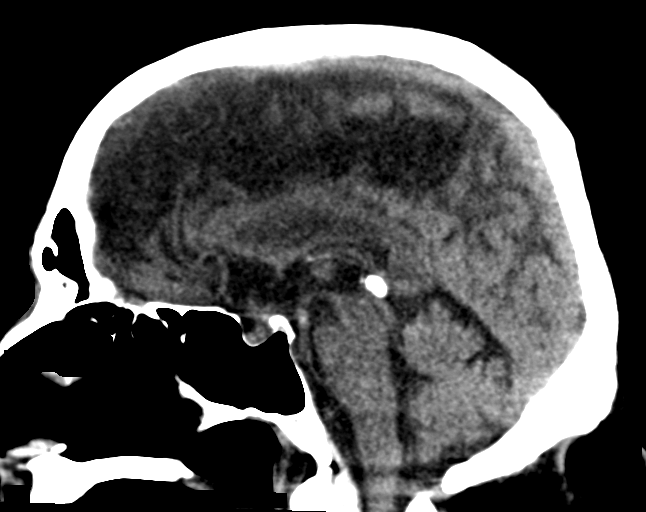
[im 38/57  brain]
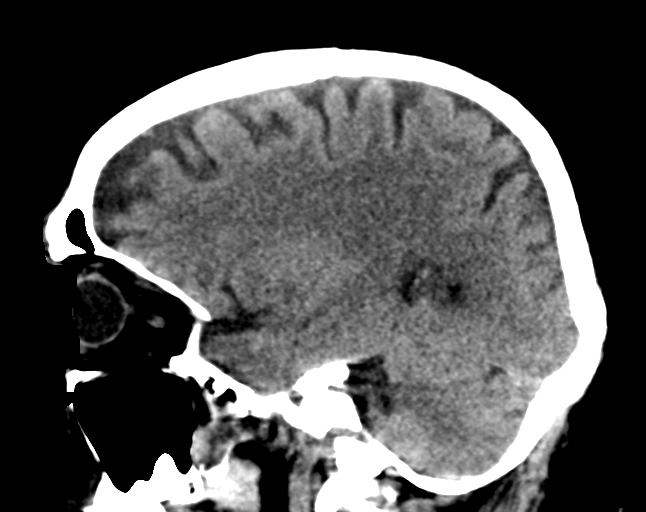

[17 of 47 positions shown; findings below may reference images not displayed]

FINDINGS: Brain: No mass lesion, intraparenchymal hemorrhage or extra-axial
collection. No evidence of acute cortical infarct. There is
periventricular hypoattenuation compatible with chronic
microvascular disease.

Vascular: No hyperdense vessel or unexpected calcification.

Skull: Normal visualized skull base, calvarium and extracranial soft
tissues.

Sinuses/Orbits: No sinus fluid levels or advanced mucosal
thickening. No mastoid effusion. Normal orbits.
IMPRESSION: No acute intracranial abnormality.

## 2019-07-06 NOTE — Progress Notes (Signed)
Cardiology Office Note    Date:  07/10/2019   ID:  LEHMAN HANSER, DOB [birth year], MRN [id number]  PCP:  Manfred Shirts, PA  Cardiologist:   [place], MD    History of Present Illness:  Kristopher Ayers is a 85 y.o. male seen for follow up CAD and PVCs.  He has a history of CAD and is s/p CABG in 2001. This included LIMA to the LAD, SVG to Ramus, and SVG to RCA. He did well until the end of 2016 when he developed progressive dyspnea on exertion. A stress test was abnormal and this led to a cardiac cath. Cath films reviewed from 09/18/15: Native LAD and RCA occluded. First OM occluded. Mid LCX and second OM with obstructive disease that was successfully stented. LIMA sequential to diagonal and LAD is patent. SVG to OM1 is patent. SVG to PDA is patent-somewhat aneurysmal. Focal ulcerative but nonobstructive plaque in the proximal SVG body. The LCx was stented with a 3.0 x 15 mm Xience stent and the OM with a 2.75 x 15 mm Xience stent. Echo at that time showed normal LV function with EF 50-55%. There was aortic valve sclerosis without stenosis. He has a history of remote tobacco use 50 years ago, COPD, HTN, and hyperlipidemia. Also family history of CAD.  In June 2018 he underwent surgery on his right shoulder by Dr. Veverly Fells. Post op he was noted to have frequent PVCs with bigeminy. He was seen by my cardiology colleagues. Electrolytes were normal. Patient was asymptomatic. Troponin levels minimally elevated with flat trend. Increase metoprolol dose was mentioned but ultimately patient discharge on 25 mg daily.Was discharged to a nursing facility. Nurse  concerned about bradycardia with HR 37 but this was felt to be due to the fact that he was in bigeminy and pulse was undercounted.   Echo previously done at facility by a mobile health service- showed normal LV function, mild AS, mild MR and LAE.   Was seen for a televisit in June and was doing well from a cardiac standpoint. Had some issues with  gout. He is now on allopurinol. He notes more swelling in his feet. Not really watching his salt intake. Was using pickle juice before to treat gout. Denies chest pain or SOB. Complains of sciatica in right leg. On low dose gabapentin but this makes him sleepy. Renal function is worse.    Past Medical History:  Diagnosis Date  . CAD (coronary artery disease)    CABG 2001; DES to Cx and OM1 09/18/15  . CKD (chronic kidney disease)   . COPD (chronic obstructive pulmonary disease) ([place])   . Diverticulosis    patient denies  . DJD (degenerative joint disease)   . Hyperlipidemia   . Hypertension   . Internal hemorrhoids    patient denies  . Kidney stone     Past Surgical History:  Procedure Laterality Date  . ANKLE FUSION    . APPENDECTOMY    . CHOLECYSTECTOMY    . COLONOSCOPY    . CORONARY ARTERY BYPASS GRAFT  2001  . INGUINAL HERNIA REPAIR    . LEG SURGERY    . LUMBAR LAMINECTOMY     x3  . REVERSE SHOULDER ARTHROPLASTY Right 01/29/2017   Procedure: REVERSE RIGHT SHOULDER ARTHROPLASTY;  Surgeon: Netta Cedars, MD;  Location: [facility];  Service: Orthopedics;  Laterality: Right;  . ROTATOR CUFF REPAIR     x 2  . TONSILLECTOMY AND ADENOIDECTOMY    . TOTAL  KNEE ARTHROPLASTY     left    Current Medications: Allergies as of 07/10/2019      Reactions   Irbesartan Cough   WEAKNESS   Nsaids Rash, Other (See Comments)   RENAL INSUFFICIENCY   Amlodipine Swelling   SWELLING REACTION UNSPECIFIED    Buprenorphine Hcl    UNSPECIFIED REACTION    Diazepam    UNSPECIFIED REACTION    Hydrocodone-acetaminophen Rash   Morphine And Related Rash      Medication List       Accurate as of July 10, 2019  4:08 PM. If you have any questions, ask your nurse or doctor.        acetaminophen 325 MG tablet Commonly known as: TYLENOL Take 650 mg by mouth every 6 (six) hours as needed for moderate pain.   allopurinol 100 MG tablet Commonly known as: ZYLOPRIM Take 2 tablets by mouth daily.    aspirin 81 MG tablet Take 81 mg by mouth daily.   B-12 PO Take 1 tablet by mouth daily.   candesartan 8 MG tablet Commonly known as: ATACAND Take 1 tablet (8 mg total) by mouth daily.   chlorthalidone 25 MG tablet Commonly known as: HYGROTON Take 12.5 mg by mouth daily.   fluticasone 50 MCG/ACT nasal spray Commonly known as: FLONASE 1 spray by Each Nare route daily.   gabapentin 100 MG capsule Commonly known as: NEURONTIN TAKE 1 CAPSULE BY MOUTH NIGHTLY FOR 1 WEEK THEN INCREASE TO TWICE DAILY AS TOLERATED AS DIRECTED   metoprolol succinate 25 MG 24 hr tablet Commonly known as: TOPROL-XL Take 25 mg by mouth daily.   omeprazole 40 MG capsule Commonly known as: PRILOSEC Take by mouth.   pravastatin 40 MG tablet Commonly known as: PRAVACHOL Take 40 mg by mouth daily.        Allergies:   Irbesartan, Nsaids, Amlodipine, Buprenorphine hcl, Diazepam, Hydrocodone-acetaminophen, and Morphine and related   Social History   Socioeconomic History  . Marital status: Married    Spouse name: Not on file  . Number of children: 3  . Years of education: Not on file  . Highest education level: Not on file  Occupational History  . Occupation: Retired    Fish farm manager: Journalist, newspaper  Social Needs  . Financial resource strain: Not on file  . Food insecurity    Worry: Not on file    Inability: Not on file  . Transportation needs    Medical: Not on file    Non-medical: Not on file  Tobacco Use  . Smoking status: Former Research scientist (life sciences)  . Smokeless tobacco: Never Used  Substance and Sexual Activity  . Alcohol use: No  . Drug use: No  . Sexual activity: Not on file  Lifestyle  . Physical activity    Days per week: Not on file    Minutes per session: Not on file  . Stress: Not on file  Relationships  . Social Herbalist on phone: Not on file    Gets together: Not on file    Attends religious service: Not on file    Active member of club or organization: Not on file     Attends meetings of clubs or organizations: Not on file    Relationship status: Not on file  Other Topics Concern  . Not on file  Social History Narrative   Daily caffeine      Family History:  The patient's family history includes CAD in his brother; Congestive Heart  Failure in his father.   ROS:   Please see the history of present illness.    ROS All other systems reviewed and are negative.   PHYSICAL EXAM:   VS:  BP 108/67   Pulse (!) 56   Temp (!) 97.3 F (36.3 C)   Ht 5\' 5"  (1.651 m)   Wt 204 lb 3.2 oz (92.6 kg)   SpO2 96%   BMI 33.98 kg/m    GENERAL:  Well appearing elderly WM in NAD HEENT:  PERRL, EOMI, sclera are clear. Oropharynx is clear. NECK:  No jugular venous distention, carotid upstroke brisk and symmetric, no bruits, no thyromegaly or adenopathy LUNGS:  Clear to auscultation bilaterally CHEST:  Unremarkable HEART:  RRR,  PMI not displaced or sustained,S1 and S2 within normal limits, no S3, no S4: no clicks, no rubs, gr 2/6 systolic murmur RUSB.  ABD:  Soft, nontender. BS +, no masses or bruits. No hepatomegaly, no splenomegaly EXT:  2 + pulses throughout, 2+ edema, no cyanosis no clubbing SKIN:  Warm and dry.  No rashes NEURO:  Alert and oriented x 3. Cranial nerves II through XII intact. PSYCH:  Cognitively intact      Wt Readings from Last 3 Encounters:  07/10/19 204 lb 3.2 oz (92.6 kg)  05/11/18 192 lb (87.1 kg)  09/14/17 205 lb 3.2 oz (93.1 kg)      Studies/Labs Reviewed:   EKG:  EKG is ordered today.  NSR with PVCs. Rate 56.  LVH with repolarization abnormality. I have personally reviewed and interpreted this study.     Recent Labs: No results found for requested labs within last 8760 hours.   Lipid Panel No results found for: CHOL, TRIG, HDL, CHOLHDL, VLDL, LDLCALC, LDLDIRECT  Additional studies/ records that were reviewed today include:  Records from [facility] via [facility] Lipid panel in Dec. 2016 show cholesterol  149, trig- 115, HDL 45, LDL 81.  CBC and CMET normal in April 2017.   Labs dated 02/09/17: Hgb 10.2, TFTs normal. BUN 24. Creatinine 1.31. Sodium 130.  Dated 04/07/18: BUN 46, creatinine 1.71. Other chemistries normal. CBC normal. Dated 07/01/18: BUN 43, creatinine 1.88. otherwise CMET normal. Cholesterol 125, triglycerides 79, HDL 47, LDL 62. Plts. 141K, otherwise CBC normal. Dated 04/28/19: BUN 50, creatinine 1.96, potassium 5.5.  Cholesterol 123, triglycerides 63, HDL 48, LDL 62. Uric acid 5.6. CBC normal.   ASSESSMENT:    1. Coronary artery disease of bypass graft of native heart with stable angina pectoris ([place])   2. PVC (premature ventricular contraction)   3. Essential hypertension   4. Pure hypercholesterolemia   5. Stage 3b chronic kidney disease   6. Lower extremity edema      PLAN:  In order of problems listed above:  1.  PVCs chronic.  This was noted post op shoulder surgery and has persisted.  Electrolytes normal. Thyroid normal.He is completely asymptomatic. 2. Hypercholesterolemia. On statin therapy. Excellent control 3. CAD s/p CABG 2001. S/p Stenting of the LCx and OM1 in Jan 2017 with DES. On ASA only now. No anginal symptoms. 4. Aortic stenosis. Echo showed only mild AS  5. HTN control well controlled.  6. CKD stage 3b. Last renal panel was worse. 7. LE edema. Stressed importance of sodium restriction, elevation and compression hose. I am reluctant to increase diuretic therapy given renal function.   Medication Adjustments/Labs and Tests Ordered: Current medicines are reviewed at length with the patient today.  Concerns regarding medicines are outlined  above.  Medication changes, Labs and Tests ordered today are listed in the Patient Instructions below. Patient Instructions  You should wear compression hose daily  Restrict your salt intake.  Keep feet elevated when possible  Continue your cardiac meds.  Follow up in 6 months   Low-Sodium Eating Plan Sodium,  which is an element that makes up salt, helps you maintain a healthy balance of fluids in your body. Too much sodium can increase your blood pressure and cause fluid and waste to be held in your body. Your health care provider or dietitian may recommend following this plan if you have high blood pressure (hypertension), kidney disease, liver disease, or heart failure. Eating less sodium can help lower your blood pressure, reduce swelling, and protect your heart, liver, and kidneys. What are tips for following this plan? General guidelines  Most people on this plan should limit their sodium intake to 1,500-2,000 mg (milligrams) of sodium each day. Reading food labels   The Nutrition Facts label lists the amount of sodium in one serving of the food. If you eat more than one serving, you must multiply the listed amount of sodium by the number of servings.  Choose foods with less than 140 mg of sodium per serving.  Avoid foods with 300 mg of sodium or more per serving. Shopping  Look for lower-sodium products, often labeled as "low-sodium" or "no salt added."  Always check the sodium content even if foods are labeled as "unsalted" or "no salt added".  Buy fresh foods. ? Avoid canned foods and premade or frozen meals. ? Avoid canned, cured, or processed meats  Buy breads that have less than 80 mg of sodium per slice. Cooking  Eat more home-cooked food and less restaurant, buffet, and fast food.  Avoid adding salt when cooking. Use salt-free seasonings or herbs instead of table salt or sea salt. Check with your health care provider or pharmacist before using salt substitutes.  Cook with plant-based oils, such as canola, sunflower, or olive oil. Meal planning  When eating at a restaurant, ask that your food be prepared with less salt or no salt, if possible.  Avoid foods that contain MSG (monosodium glutamate). MSG is sometimes added to [place] food, bouillon, and some canned foods.  What foods are recommended? The items listed may not be a complete list. Talk with your dietitian about what dietary choices are best for you. Grains Low-sodium cereals, including oats, puffed wheat and rice, and shredded wheat. Low-sodium crackers. Unsalted rice. Unsalted pasta. Low-sodium bread. Whole-grain breads and whole-grain pasta. Vegetables Fresh or frozen vegetables. "No salt added" canned vegetables. "No salt added" tomato sauce and paste. Low-sodium or reduced-sodium tomato and vegetable juice. Fruits Fresh, frozen, or canned fruit. Fruit juice. Meats and other protein foods Fresh or frozen (no salt added) meat, poultry, seafood, and fish. Low-sodium canned tuna and salmon. Unsalted nuts. Dried peas, beans, and lentils without added salt. Unsalted canned beans. Eggs. Unsalted nut butters. Dairy Milk. Soy milk. Cheese that is naturally low in sodium, such as ricotta cheese, fresh mozzarella, or Swiss cheese Low-sodium or reduced-sodium cheese. Cream cheese. Yogurt. Fats and oils Unsalted butter. Unsalted margarine with no trans fat. Vegetable oils such as canola or olive oils. Seasonings and other foods Fresh and dried herbs and spices. Salt-free seasonings. Low-sodium mustard and ketchup. Sodium-free salad dressing. Sodium-free light mayonnaise. Fresh or refrigerated horseradish. Lemon juice. Vinegar. Homemade, reduced-sodium, or low-sodium soups. Unsalted popcorn and pretzels. Low-salt or salt-free chips. What foods are  not recommended? The items listed may not be a complete list. Talk with your dietitian about what dietary choices are best for you. Grains Instant hot cereals. Bread stuffing, pancake, and biscuit mixes. Croutons. Seasoned rice or pasta mixes. Noodle soup cups. Boxed or frozen macaroni and cheese. Regular salted crackers. Self-rising flour. Vegetables Sauerkraut, pickled vegetables, and relishes. Olives. [place] fries. Onion rings. Regular canned vegetables (not  low-sodium or reduced-sodium). Regular canned tomato sauce and paste (not low-sodium or reduced-sodium). Regular tomato and vegetable juice (not low-sodium or reduced-sodium). Frozen vegetables in sauces. Meats and other protein foods Meat or fish that is salted, canned, smoked, spiced, or pickled. Bacon, ham, sausage, hotdogs, corned beef, chipped beef, packaged lunch meats, salt pork, jerky, pickled herring, anchovies, regular canned tuna, sardines, salted nuts. Dairy Processed cheese and cheese spreads. Cheese curds. Blue cheese. Feta cheese. String cheese. Regular cottage cheese. Buttermilk. Canned milk. Fats and oils Salted butter. Regular margarine. Ghee. Bacon fat. Seasonings and other foods Onion salt, garlic salt, seasoned salt, table salt, and sea salt. Canned and packaged gravies. Worcestershire sauce. Tartar sauce. Barbecue sauce. Teriyaki sauce. Soy sauce, including reduced-sodium. Steak sauce. Fish sauce. Oyster sauce. Cocktail sauce. Horseradish that you find on the shelf. Regular ketchup and mustard. Meat flavorings and tenderizers. Bouillon cubes. Hot sauce and Tabasco sauce. Premade or packaged marinades. Premade or packaged taco seasonings. Relishes. Regular salad dressings. Salsa. Potato and tortilla chips. Corn chips and puffs. Salted popcorn and pretzels. Canned or dried soups. Pizza. Frozen entrees and pot pies. Summary  Eating less sodium can help lower your blood pressure, reduce swelling, and protect your heart, liver, and kidneys.  Most people on this plan should limit their sodium intake to 1,500-2,000 mg (milligrams) of sodium each day.  Canned, boxed, and frozen foods are high in sodium. Restaurant foods, fast foods, and pizza are also very high in sodium. You also get sodium by adding salt to food.  Try to cook at home, eat more fresh fruits and vegetables, and eat less fast food, canned, processed, or prepared foods. This information is not intended to replace advice  given to you by your health care provider. Make sure you discuss any questions you have with your health care provider. Document Released: 02/06/2002 Document Revised: 07/30/2017 Document Reviewed: 08/10/2016 Elsevier Patient Education  2020 [place],  [place], MD  07/10/2019 4:08 PM    [facility] [address], [place], [place], [address] [phone]

## 2019-07-10 ENCOUNTER — Other Ambulatory Visit: Payer: Self-pay

## 2019-07-10 ENCOUNTER — Ambulatory Visit: Payer: Medicare Other | Admitting: Cardiology

## 2019-07-10 ENCOUNTER — Encounter: Payer: Self-pay | Admitting: Cardiology

## 2019-07-10 VITALS — BP 108/67 | HR 56 | Temp 97.3°F | Ht 65.0 in | Wt 204.2 lb

## 2019-07-10 DIAGNOSIS — I10 Essential (primary) hypertension: Secondary | ICD-10-CM

## 2019-07-10 DIAGNOSIS — I493 Ventricular premature depolarization: Secondary | ICD-10-CM

## 2019-07-10 DIAGNOSIS — R600 Localized edema: Secondary | ICD-10-CM

## 2019-07-10 DIAGNOSIS — E7800 Pure hypercholesterolemia, unspecified: Secondary | ICD-10-CM | POA: Diagnosis not present

## 2019-07-10 DIAGNOSIS — I25708 Atherosclerosis of coronary artery bypass graft(s), unspecified, with other forms of angina pectoris: Secondary | ICD-10-CM | POA: Diagnosis not present

## 2019-07-10 DIAGNOSIS — N1832 Chronic kidney disease, stage 3b: Secondary | ICD-10-CM

## 2019-07-10 NOTE — Patient Instructions (Addendum)
You should wear compression hose daily  Restrict your salt intake.  Keep feet elevated when possible  Continue your cardiac meds.  Follow up in 6 months   Low-Sodium Eating Plan Sodium, which is an element that makes up salt, helps you maintain a healthy balance of fluids in your body. Too much sodium can increase your blood pressure and cause fluid and waste to be held in your body. Your health care provider or dietitian may recommend following this plan if you have high blood pressure (hypertension), kidney disease, liver disease, or heart failure. Eating less sodium can help lower your blood pressure, reduce swelling, and protect your heart, liver, and kidneys. What are tips for following this plan? General guidelines  Most people on this plan should limit their sodium intake to 1,500-2,000 mg (milligrams) of sodium each day. Reading food labels   The Nutrition Facts label lists the amount of sodium in one serving of the food. If you eat more than one serving, you must multiply the listed amount of sodium by the number of servings.  Choose foods with less than 140 mg of sodium per serving.  Avoid foods with 300 mg of sodium or more per serving. Shopping  Look for lower-sodium products, often labeled as "low-sodium" or "no salt added."  Always check the sodium content even if foods are labeled as "unsalted" or "no salt added".  Buy fresh foods. ? Avoid canned foods and premade or frozen meals. ? Avoid canned, cured, or processed meats  Buy breads that have less than 80 mg of sodium per slice. Cooking  Eat more home-cooked food and less restaurant, buffet, and fast food.  Avoid adding salt when cooking. Use salt-free seasonings or herbs instead of table salt or sea salt. Check with your health care provider or pharmacist before using salt substitutes.  Cook with plant-based oils, such as canola, sunflower, or olive oil. Meal planning  When eating at a restaurant, ask  that your food be prepared with less salt or no salt, if possible.  Avoid foods that contain MSG (monosodium glutamate). MSG is sometimes added to [place] food, bouillon, and some canned foods. What foods are recommended? The items listed may not be a complete list. Talk with your dietitian about what dietary choices are best for you. Grains Low-sodium cereals, including oats, puffed wheat and rice, and shredded wheat. Low-sodium crackers. Unsalted rice. Unsalted pasta. Low-sodium bread. Whole-grain breads and whole-grain pasta. Vegetables Fresh or frozen vegetables. "No salt added" canned vegetables. "No salt added" tomato sauce and paste. Low-sodium or reduced-sodium tomato and vegetable juice. Fruits Fresh, frozen, or canned fruit. Fruit juice. Meats and other protein foods Fresh or frozen (no salt added) meat, poultry, seafood, and fish. Low-sodium canned tuna and salmon. Unsalted nuts. Dried peas, beans, and lentils without added salt. Unsalted canned beans. Eggs. Unsalted nut butters. Dairy Milk. Soy milk. Cheese that is naturally low in sodium, such as ricotta cheese, fresh mozzarella, or Swiss cheese Low-sodium or reduced-sodium cheese. Cream cheese. Yogurt. Fats and oils Unsalted butter. Unsalted margarine with no trans fat. Vegetable oils such as canola or olive oils. Seasonings and other foods Fresh and dried herbs and spices. Salt-free seasonings. Low-sodium mustard and ketchup. Sodium-free salad dressing. Sodium-free light mayonnaise. Fresh or refrigerated horseradish. Lemon juice. Vinegar. Homemade, reduced-sodium, or low-sodium soups. Unsalted popcorn and pretzels. Low-salt or salt-free chips. What foods are not recommended? The items listed may not be a complete list. Talk with your dietitian about what dietary choices are  best for you. Grains Instant hot cereals. Bread stuffing, pancake, and biscuit mixes. Croutons. Seasoned rice or pasta mixes. Noodle soup cups. Boxed or  frozen macaroni and cheese. Regular salted crackers. Self-rising flour. Vegetables Sauerkraut, pickled vegetables, and relishes. Olives. [place] fries. Onion rings. Regular canned vegetables (not low-sodium or reduced-sodium). Regular canned tomato sauce and paste (not low-sodium or reduced-sodium). Regular tomato and vegetable juice (not low-sodium or reduced-sodium). Frozen vegetables in sauces. Meats and other protein foods Meat or fish that is salted, canned, smoked, spiced, or pickled. Bacon, ham, sausage, hotdogs, corned beef, chipped beef, packaged lunch meats, salt pork, jerky, pickled herring, anchovies, regular canned tuna, sardines, salted nuts. Dairy Processed cheese and cheese spreads. Cheese curds. Blue cheese. Feta cheese. String cheese. Regular cottage cheese. Buttermilk. Canned milk. Fats and oils Salted butter. Regular margarine. Ghee. Bacon fat. Seasonings and other foods Onion salt, garlic salt, seasoned salt, table salt, and sea salt. Canned and packaged gravies. Worcestershire sauce. Tartar sauce. Barbecue sauce. Teriyaki sauce. Soy sauce, including reduced-sodium. Steak sauce. Fish sauce. Oyster sauce. Cocktail sauce. Horseradish that you find on the shelf. Regular ketchup and mustard. Meat flavorings and tenderizers. Bouillon cubes. Hot sauce and Tabasco sauce. Premade or packaged marinades. Premade or packaged taco seasonings. Relishes. Regular salad dressings. Salsa. Potato and tortilla chips. Corn chips and puffs. Salted popcorn and pretzels. Canned or dried soups. Pizza. Frozen entrees and pot pies. Summary  Eating less sodium can help lower your blood pressure, reduce swelling, and protect your heart, liver, and kidneys.  Most people on this plan should limit their sodium intake to 1,500-2,000 mg (milligrams) of sodium each day.  Canned, boxed, and frozen foods are high in sodium. Restaurant foods, fast foods, and pizza are also very high in sodium. You also get sodium by  adding salt to food.  Try to cook at home, eat more fresh fruits and vegetables, and eat less fast food, canned, processed, or prepared foods. This information is not intended to replace advice given to you by your health care provider. Make sure you discuss any questions you have with your health care provider. Document Released: 02/06/2002 Document Revised: 07/30/2017 Document Reviewed: 08/10/2016 Elsevier Patient Education  2020 [employer].

## 2019-08-07 ENCOUNTER — Ambulatory Visit: Payer: Medicare Other | Admitting: Cardiology

## 2019-08-10 ENCOUNTER — Telehealth: Payer: Self-pay | Admitting: Cardiology

## 2019-08-10 NOTE — Telephone Encounter (Signed)
I will forward to [facility] for advise.

## 2019-08-10 NOTE — Telephone Encounter (Signed)
Agree he should follow advice of his primary care until he has follow up with nephrology. If BP goes up there are other medication we can consider.   Jaideep Pollack [place] MD, [facility]

## 2019-08-10 NOTE — Telephone Encounter (Signed)
Pt c/o medication issue:  1. Name of Medication: candesartan (ATACAND) 8 MG tablet  2. How are you currently taking this medication (dosage and times per day)? Takes one a day  3. Are you having a reaction (difficulty breathing--STAT)? no  4. What is your medication issue? Patient's daughter states the patient was told to go off his candesartan (ATACAND) 8 MG tablet by his kidney specialist, [facility]. PCP told him to take half a tablet for 7 days and then stop taking it. His appointment with [facility] isn't scheduled yet and may not be until march. She is concerned of him being off the medication for so long.

## 2019-08-10 NOTE — Telephone Encounter (Signed)
Left message on phone with Dr.Jordan's recommendations and to call back if she any further questions.

## 2019-12-08 ENCOUNTER — Other Ambulatory Visit: Payer: Self-pay

## 2019-12-08 MED ORDER — CANDESARTAN CILEXETIL 8 MG PO TABS: 8.0000 mg | ORAL_TABLET | Freq: Every day | ORAL | 3 refills | Status: DC

## 2019-12-12 ENCOUNTER — Other Ambulatory Visit: Payer: Self-pay

## 2019-12-12 MED ORDER — CANDESARTAN CILEXETIL 8 MG PO TABS: 8.0000 mg | ORAL_TABLET | Freq: Every day | ORAL | 0 refills | Status: DC

## 2019-12-29 NOTE — Progress Notes (Signed)
Cardiology Office Note    Date:  01/02/2020   ID:  Kristopher Ayers, DOB Jan 10, 1926, MRN [id number]  PCP:  Manfred Shirts, PA  Cardiologist:  Christabella Alvira Martinique, MD    History of Present Illness:  Kristopher Ayers is a 86 y.o. male seen for follow up CAD and PVCs.  He has a history of CAD and is s/p CABG in 2001. This included LIMA to the LAD, SVG to Ramus, and SVG to RCA. He did well until the end of 2016 when he developed progressive dyspnea on exertion. A stress test was abnormal and this led to a cardiac cath. Cath films reviewed from 09/18/15: Native LAD and RCA occluded. First OM occluded. Mid LCX and second OM with obstructive disease that was successfully stented. LIMA sequential to diagonal and LAD is patent. SVG to OM1 is patent. SVG to PDA is patent-somewhat aneurysmal. Focal ulcerative but nonobstructive plaque in the proximal SVG body. The LCx was stented with a 3.0 x 15 mm Xience stent and the OM with a 2.75 x 15 mm Xience stent. Echo at that time showed normal LV function with EF 50-55%. There was aortic valve sclerosis without stenosis. He has a history of remote tobacco use 50 years ago, COPD, HTN, and hyperlipidemia. Also family history of CAD.  In June 2018 he underwent surgery on his right shoulder by Dr. Veverly Fells. Post op he was noted to have frequent PVCs with bigeminy. He was seen by my cardiology colleagues. Electrolytes were normal. Patient was asymptomatic. Troponin levels minimally elevated with flat trend. Increase metoprolol dose was mentioned but ultimately patient discharge on 25 mg daily.Was discharged to a nursing facility. Nurse  concerned about bradycardia with HR 37 but this was felt to be due to the fact that he was in bigeminy and pulse was undercounted.   Echo previously done at facility by a mobile health service- showed normal LV function, mild AS, mild MR and LAE.   He is seen today with his son. Doesn't do a whole lot anymore. Walks with a walker. No longer drives.  Complains of chronic dyspnea. Has leg edema that is stable and weight is unchanged. No chest pain. Complains of left shoulder pain. Getting some therapy at home for his legs.    Past Medical History:  Diagnosis Date  . CAD (coronary artery disease)    CABG 2001; DES to Cx and OM1 09/18/15  . CKD (chronic kidney disease)   . COPD (chronic obstructive pulmonary disease) ([place])   . Diverticulosis    patient denies  . DJD (degenerative joint disease)   . Hyperlipidemia   . Hypertension   . Internal hemorrhoids    patient denies  . Kidney stone     Past Surgical History:  Procedure Laterality Date  . ANKLE FUSION    . APPENDECTOMY    . CHOLECYSTECTOMY    . COLONOSCOPY    . CORONARY ARTERY BYPASS GRAFT  2001  . INGUINAL HERNIA REPAIR    . LEG SURGERY    . LUMBAR LAMINECTOMY     x3  . REVERSE SHOULDER ARTHROPLASTY Right 01/29/2017   Procedure: REVERSE RIGHT SHOULDER ARTHROPLASTY;  Surgeon: Netta Cedars, MD;  Location: [facility];  Service: Orthopedics;  Laterality: Right;  . ROTATOR CUFF REPAIR     x 2  . TONSILLECTOMY AND ADENOIDECTOMY    . TOTAL KNEE ARTHROPLASTY     left    Current Medications: Allergies as of 01/02/2020  Reactions   Irbesartan Cough   WEAKNESS   Nsaids Rash, Other (See Comments)   RENAL INSUFFICIENCY   Amlodipine Swelling   SWELLING REACTION UNSPECIFIED    Buprenorphine Hcl    UNSPECIFIED REACTION    Diazepam    UNSPECIFIED REACTION    Hydrocodone-acetaminophen Rash   Morphine And Related Rash      Medication List       Accurate as of Jan 02, 2020  4:53 PM. If you have any questions, ask your nurse or doctor.        acetaminophen 325 MG tablet Commonly known as: TYLENOL Take 650 mg by mouth every 6 (six) hours as needed for moderate pain.   allopurinol 100 MG tablet Commonly known as: ZYLOPRIM Take 2 tablets by mouth daily.   aspirin 81 MG tablet Take 81 mg by mouth daily.   B-12 PO Take 1 tablet by mouth daily.   candesartan 8 MG  tablet Commonly known as: ATACAND Take 1 tablet (8 mg total) by mouth daily.   chlorthalidone 25 MG tablet Commonly known as: HYGROTON Take 12.5 mg by mouth daily.   fluticasone 50 MCG/ACT nasal spray Commonly known as: FLONASE 1 spray by Each Nare route daily.   gabapentin 100 MG capsule Commonly known as: NEURONTIN TAKE 1 CAPSULE BY MOUTH NIGHTLY FOR 1 WEEK THEN INCREASE TO TWICE DAILY AS TOLERATED AS DIRECTED   metoprolol succinate 25 MG 24 hr tablet Commonly known as: TOPROL-XL Take 25 mg by mouth daily.   omeprazole 40 MG capsule Commonly known as: PRILOSEC Take by mouth.   pravastatin 40 MG tablet Commonly known as: PRAVACHOL Take 40 mg by mouth daily.        Allergies:   Irbesartan, Nsaids, Amlodipine, Buprenorphine hcl, Diazepam, Hydrocodone-acetaminophen, and Morphine and related   Social History   Socioeconomic History  . Marital status: Married    Spouse name: Not on file  . Number of children: 3  . Years of education: Not on file  . Highest education level: Not on file  Occupational History  . Occupation: Retired    Fish farm manager: Journalist, newspaper  Tobacco Use  . Smoking status: Former Research scientist (life sciences)  . Smokeless tobacco: Never Used  Substance and Sexual Activity  . Alcohol use: No  . Drug use: No  . Sexual activity: Not on file  Other Topics Concern  . Not on file  Social History Narrative   Daily caffeine    Social Determinants of Health   Financial Resource Strain:   . Difficulty of Paying Living Expenses:   Food Insecurity:   . Worried About Charity fundraiser in the Last Year:   . Arboriculturist in the Last Year:   Transportation Needs:   . Film/video editor (Medical):   Marland Kitchen Lack of Transportation (Non-Medical):   Physical Activity:   . Days of Exercise per Week:   . Minutes of Exercise per Session:   Stress:   . Feeling of Stress :   Social Connections:   . Frequency of Communication with Friends and Family:   . Frequency of Social  Gatherings with Friends and Family:   . Attends Religious Services:   . Active Member of Clubs or Organizations:   . Attends Archivist Meetings:   Marland Kitchen Marital Status:      Family History:  The patient's family history includes CAD in his brother; Congestive Heart Failure in his father.   ROS:   Please see the history  of present illness.    ROS All other systems reviewed and are negative.   PHYSICAL EXAM:   VS:  BP 132/70   Pulse 60   Ht 5\' 5"  (1.651 m)   Wt 208 lb (94.3 kg)   SpO2 96%   BMI 34.61 kg/m    GENERAL:  Well appearing elderly WM in NAD, walks with walker HEENT:  PERRL, EOMI, sclera are clear. Oropharynx is clear. NECK:  No jugular venous distention, carotid upstroke brisk and symmetric, no bruits, no thyromegaly or adenopathy LUNGS:  Clear to auscultation bilaterally CHEST:  Unremarkable HEART:  RRR,  PMI not displaced or sustained,S1 and S2 within normal limits, no S3, no S4: no clicks, no rubs, gr 2/6 systolic murmur RUSB.  ABD:  Soft, nontender. BS +, no masses or bruits. No hepatomegaly, no splenomegaly EXT:  2 + pulses throughout, 2+ edema pretibial, no cyanosis no clubbing SKIN:  Warm and dry.  No rashes NEURO:  Alert and oriented x 3. Cranial nerves II through XII intact. PSYCH:  Cognitively intact      Wt Readings from Last 3 Encounters:  01/02/20 208 lb (94.3 kg)  07/10/19 204 lb 3.2 oz (92.6 kg)  05/11/18 192 lb (87.1 kg)      Studies/Labs Reviewed:   EKG:  EKG is not ordered today.    Recent Labs: No results found for requested labs within last 8760 hours.   Lipid Panel No results found for: CHOL, TRIG, HDL, CHOLHDL, VLDL, LDLCALC, LDLDIRECT  Additional studies/ records that were reviewed today include:  Records from [facility] Lipid panel in Dec. 2016 show cholesterol 149, trig- 115, HDL 45, LDL 81.  CBC and CMET normal in April 2017.   Labs dated 02/09/17: Hgb 10.2, TFTs normal. BUN 24.  Creatinine 1.31. Sodium 130.  Dated 04/07/18: BUN 46, creatinine 1.71. Other chemistries normal. CBC normal. Dated 07/01/18: BUN 43, creatinine 1.88. otherwise CMET normal. Cholesterol 125, triglycerides 79, HDL 47, LDL 62. Plts. 141K, otherwise CBC normal. Dated 04/28/19: BUN 50, creatinine 1.96, potassium 5.5.  Cholesterol 123, triglycerides 63, HDL 48, LDL 62. Uric acid 5.6. CBC normal.  Dated 11/28/19: BUN 53, creatinine 2.0. otherwise CMET normal. Cholesterol 130, triglycerides 97, HDL 45, LDL 66. CBC normal  ASSESSMENT:    1. Coronary artery disease of bypass graft of native heart with stable angina pectoris ([place])   2. PVC (premature ventricular contraction)   3. Essential hypertension   4. Lower extremity edema   5. Stage 3b chronic kidney disease   6. Pure hypercholesterolemia      PLAN:  In order of problems listed above:  1.  PVCs chronic.  He is completely asymptomatic. 2. Hypercholesterolemia. On statin therapy. Excellent control 3. CAD s/p CABG 2001. S/p Stenting of the LCx and OM1 in Jan 2017 with DES. On ASA only now. No anginal symptoms. 4. Aortic stenosis. Echo showed only mild AS  5. HTN control well controlled.  6. CKD stage 3b. Last creatinine 2.0. followed by Dr Joelyn Oms. 7. LE edema. Stressed importance of sodium restriction, elevation and compression hose. I am reluctant to increase diuretic therapy given renal function.   Follow up 6 months.  Medication Adjustments/Labs and Tests Ordered: Current medicines are reviewed at length with the patient today.  Concerns regarding medicines are outlined above.  Medication changes, Labs and Tests ordered today are listed in the Patient Instructions below. There are no Patient Instructions on file for this visit.   Signed,  Norlan Rann Martinique, MD  01/02/2020 4:53 PM    [facility] [address] [place], [place], [address] [phone]

## 2020-01-02 ENCOUNTER — Ambulatory Visit: Payer: Medicare Other | Admitting: Cardiology

## 2020-01-02 ENCOUNTER — Other Ambulatory Visit: Payer: Self-pay

## 2020-01-02 ENCOUNTER — Encounter: Payer: Self-pay | Admitting: Cardiology

## 2020-01-02 VITALS — BP 132/70 | HR 60 | Ht 65.0 in | Wt 208.0 lb

## 2020-01-02 DIAGNOSIS — I10 Essential (primary) hypertension: Secondary | ICD-10-CM

## 2020-01-02 DIAGNOSIS — I493 Ventricular premature depolarization: Secondary | ICD-10-CM

## 2020-01-02 DIAGNOSIS — E7800 Pure hypercholesterolemia, unspecified: Secondary | ICD-10-CM

## 2020-01-02 DIAGNOSIS — N1832 Chronic kidney disease, stage 3b: Secondary | ICD-10-CM

## 2020-01-02 DIAGNOSIS — I25708 Atherosclerosis of coronary artery bypass graft(s), unspecified, with other forms of angina pectoris: Secondary | ICD-10-CM | POA: Diagnosis not present

## 2020-01-02 DIAGNOSIS — R600 Localized edema: Secondary | ICD-10-CM | POA: Diagnosis not present

## 2020-10-24 ENCOUNTER — Telehealth: Payer: Self-pay | Admitting: Cardiology

## 2020-10-24 DIAGNOSIS — I2510 Atherosclerotic heart disease of native coronary artery without angina pectoris: Secondary | ICD-10-CM

## 2020-10-24 DIAGNOSIS — I10 Essential (primary) hypertension: Secondary | ICD-10-CM

## 2020-10-24 DIAGNOSIS — E7800 Pure hypercholesterolemia, unspecified: Secondary | ICD-10-CM

## 2020-10-24 NOTE — Telephone Encounter (Signed)
Yes I would get lipids and CMET on upcoming visit

## 2020-10-24 NOTE — Telephone Encounter (Signed)
    Elta Guadeloupe would like to ask Dr. Martinique if pt needs any lab work before his appt on 03/21, he said pt is hard to go around places and will be great if he pt can do everything at the same time.

## 2020-10-25 ENCOUNTER — Other Ambulatory Visit: Payer: Self-pay

## 2020-10-25 DIAGNOSIS — I10 Essential (primary) hypertension: Secondary | ICD-10-CM

## 2020-10-25 DIAGNOSIS — I2510 Atherosclerotic heart disease of native coronary artery without angina pectoris: Secondary | ICD-10-CM

## 2020-10-25 DIAGNOSIS — E7800 Pure hypercholesterolemia, unspecified: Secondary | ICD-10-CM

## 2020-10-25 NOTE — Telephone Encounter (Signed)
Spoke to patient's son Kristopher Ayers Dr.Jordan advised father will need cmet and lipid panel before his follow up appointment.Orders placed.

## 2020-11-01 ENCOUNTER — Other Ambulatory Visit: Payer: Self-pay

## 2020-11-01 DIAGNOSIS — R7989 Other specified abnormal findings of blood chemistry: Secondary | ICD-10-CM

## 2020-11-01 LAB — COMPREHENSIVE METABOLIC PANEL
ALT: 11 IU/L (ref 0–44)
AST: 13 IU/L (ref 0–40)
Albumin/Globulin Ratio: 1.7 (ref 1.2–2.2)
Albumin: 4.2 g/dL (ref 3.5–4.6)
Alkaline Phosphatase: 78 IU/L (ref 44–121)
BUN/Creatinine Ratio: 25 — ABNORMAL HIGH (ref 10–24)
BUN: 52 mg/dL — ABNORMAL HIGH (ref 10–36)
Bilirubin Total: 0.6 mg/dL (ref 0.0–1.2)
CO2: 25 mmol/L (ref 20–29)
Calcium: 9.4 mg/dL (ref 8.6–10.2)
Chloride: 101 mmol/L (ref 96–106)
Creatinine, Ser: 2.11 mg/dL — ABNORMAL HIGH (ref 0.76–1.27)
Globulin, Total: 2.5 g/dL (ref 1.5–4.5)
Glucose: 96 mg/dL (ref 65–99)
Potassium: 5.5 mmol/L — ABNORMAL HIGH (ref 3.5–5.2)
Sodium: 142 mmol/L (ref 134–144)
Total Protein: 6.7 g/dL (ref 6.0–8.5)
eGFR: 29 mL/min/{1.73_m2} — ABNORMAL LOW (ref >59)

## 2020-11-01 LAB — LIPID PANEL
Chol/HDL Ratio: 2.9 ratio (ref 0.0–5.0)
Cholesterol, Total: 134 mg/dL (ref 100–199)
HDL: 47 mg/dL (ref >39)
LDL Chol Calc (NIH): 70 mg/dL (ref 0–99)
Triglycerides: 89 mg/dL (ref 0–149)
VLDL Cholesterol Cal: 17 mg/dL (ref 5–40)

## 2020-11-04 ENCOUNTER — Telehealth: Payer: Self-pay

## 2020-11-04 NOTE — Telephone Encounter (Signed)
Received message from operator patient's daughter Kristopher Ayers would like me to call her back about father.Melinda's phone # not listed in chart.Patient called spoke to wife she stated she did not have her phone #.She stated to call her son Kristopher Ayers.Message left on Kristopher Ayers's personal voice mail to call me back.

## 2020-11-05 ENCOUNTER — Telehealth: Payer: Self-pay | Admitting: Cardiology

## 2020-11-05 NOTE — Telephone Encounter (Signed)
Spoke to patient's son Elta Guadeloupe he stated father has not been taking Candesartan and Chlorthalidone for at least 4 months or longer.Stated father saw Dr.Sanford this morning and was told his kidney functions are better.I will request Dr.Sanford's office note.I will make Dr.Jordan aware.

## 2020-11-05 NOTE — Telephone Encounter (Signed)
Kristopher Ayers, son of the patient called. He would like to speak to [place] about the patient's recent test results and medication changes. The Son states that the office spoke to his Mom (Patient's Wife) last week,  but the patient's wife was not able to relay the information to the Son. The son would like to speak to the Nurse directly

## 2020-11-15 NOTE — Progress Notes (Signed)
Cardiology Office Note    Date:  11/18/2020   ID:  Kristopher Ayers, DOB [birth year], MRN [id number]  PCP:  Manfred Shirts, PA  Cardiologist:  Amire Gossen Martinique, MD    History of Present Illness:  Kristopher Ayers is a 87 y.o. male seen for follow up CAD and PVCs.  He has a history of CAD and is s/p CABG in 2001. This included LIMA to the LAD, SVG to Ramus, and SVG to RCA. He did well until the end of 2016 when he developed progressive dyspnea on exertion. A stress test was abnormal and this led to a cardiac cath. Cath films reviewed from 09/18/15: Native LAD and RCA occluded. First OM occluded. Mid LCX and second OM with obstructive disease that was successfully stented. LIMA sequential to diagonal and LAD is patent. SVG to OM1 is patent. SVG to PDA is patent-somewhat aneurysmal. Focal ulcerative but nonobstructive plaque in the proximal SVG body. The LCx was stented with a 3.0 x 15 mm Xience stent and the OM with a 2.75 x 15 mm Xience stent. Echo at that time showed normal LV function with EF 50-55%. There was aortic valve sclerosis without stenosis. He has a history of remote tobacco use 50 years ago, COPD, HTN, and hyperlipidemia. Also family history of CAD.  In June 2018 he underwent surgery on his right shoulder by Dr. Veverly Fells. Post op he was noted to have frequent PVCs with bigeminy. He was seen by my cardiology colleagues. Electrolytes were normal. Patient was asymptomatic. Troponin levels minimally elevated with flat trend. Increase metoprolol dose was mentioned but ultimately patient discharge on 25 mg daily.Was discharged to a nursing facility. Nurse  concerned about bradycardia with HR 37 but this was felt to be due to the fact that he was in bigeminy and pulse was undercounted.   Echo previously done at facility by a mobile health service- showed normal LV function, mild AS, mild MR and LAE.   More recently his ARB and HCT were discontinued about 4 months ago on advice of Dr Joelyn Oms with  improvement in renal parameters. Seen there on 3/8. Noted creatinine 1.99. GFR 31. Potassium 5.3. BP was well controlled. No anemia. Hgb 14.4.  He is seen today with his son. Doesn't do a whole lot anymore. Walks with a walker. No longer drives. Still complains of chronic dyspnea. No chest pain. Has leg edema that is stable and weight is down 5 lbs since last May.    Past Medical History:  Diagnosis Date  . CAD (coronary artery disease)    CABG 2001; DES to Cx and OM1 09/18/15  . CKD (chronic kidney disease)   . COPD (chronic obstructive pulmonary disease) ([place])   . Diverticulosis    patient denies  . DJD (degenerative joint disease)   . Hyperlipidemia   . Hypertension   . Internal hemorrhoids    patient denies  . Kidney stone     Past Surgical History:  Procedure Laterality Date  . ANKLE FUSION    . APPENDECTOMY    . CHOLECYSTECTOMY    . COLONOSCOPY    . CORONARY ARTERY BYPASS GRAFT  2001  . INGUINAL HERNIA REPAIR    . LEG SURGERY    . LUMBAR LAMINECTOMY     x3  . REVERSE SHOULDER ARTHROPLASTY Right 01/29/2017   Procedure: REVERSE RIGHT SHOULDER ARTHROPLASTY;  Surgeon: Netta Cedars, MD;  Location: [facility];  Service: Orthopedics;  Laterality: Right;  . ROTATOR CUFF REPAIR  x 2  . TONSILLECTOMY AND ADENOIDECTOMY    . TOTAL KNEE ARTHROPLASTY     left    Current Medications: Allergies as of 11/18/2020      Reactions   Irbesartan Cough   WEAKNESS   Nsaids Rash, Other (See Comments)   RENAL INSUFFICIENCY   Amlodipine Swelling   SWELLING REACTION UNSPECIFIED    Buprenorphine Hcl    UNSPECIFIED REACTION    Diazepam    UNSPECIFIED REACTION    Hydrocodone-acetaminophen Rash   Morphine And Related Rash      Medication List       Accurate as of November 18, 2020 11:19 AM. If you have any questions, ask your nurse or doctor.        acetaminophen 325 MG tablet Commonly known as: TYLENOL Take 650 mg by mouth every 6 (six) hours as needed for moderate pain.    allopurinol 100 MG tablet Commonly known as: ZYLOPRIM Take 2 tablets by mouth daily.   aspirin 81 MG tablet Take 81 mg by mouth daily.   B-12 PO Take 1 tablet by mouth daily.   metoprolol succinate 25 MG 24 hr tablet Commonly known as: TOPROL-XL Take 25 mg by mouth daily.   omeprazole 40 MG capsule Commonly known as: PRILOSEC Take by mouth.   pravastatin 40 MG tablet Commonly known as: PRAVACHOL Take 40 mg by mouth daily.   torsemide 20 MG tablet Commonly known as: DEMADEX Take 20 mg daily   Vitamin D 50 MCG (2000 UT) tablet Take 1 tablet (2,000 Units total) by mouth daily.        Allergies:   Irbesartan, Nsaids, Amlodipine, Buprenorphine hcl, Diazepam, Hydrocodone-acetaminophen, and Morphine and related   Social History   Socioeconomic History  . Marital status: Married    Spouse name: Not on file  . Number of children: 3  . Years of education: Not on file  . Highest education level: Not on file  Occupational History  . Occupation: Retired    Fish farm manager: Journalist, newspaper  Tobacco Use  . Smoking status: Former Research scientist (life sciences)  . Smokeless tobacco: Never Used  Vaping Use  . Vaping Use: Never used  Substance and Sexual Activity  . Alcohol use: No  . Drug use: No  . Sexual activity: Not on file  Other Topics Concern  . Not on file  Social History Narrative   Daily caffeine    Social Determinants of Health   Financial Resource Strain: Not on file  Food Insecurity: Not on file  Transportation Needs: Not on file  Physical Activity: Not on file  Stress: Not on file  Social Connections: Not on file     Family History:  The patient's family history includes CAD in his brother; Congestive Heart Failure in his father.   ROS:   Please see the history of present illness.    ROS All other systems reviewed and are negative.   PHYSICAL EXAM:   VS:  BP 138/60   Pulse (!) 54   Ht 5\' 10"  (1.778 m)   Wt 203 lb (92.1 kg)   SpO2 96%   BMI 29.13 kg/m    GENERAL:   Well appearing elderly WM in NAD, walks with walker HEENT:  PERRL, EOMI, sclera are clear. Oropharynx is clear. NECK:  No jugular venous distention, carotid upstroke brisk and symmetric, no bruits, no thyromegaly or adenopathy LUNGS:  Clear to auscultation bilaterally CHEST:  Unremarkable HEART:  RRR,  PMI not displaced or sustained,S1 and S2 within  normal limits, no S3, no S4: no clicks, no rubs, gr 2/6 systolic murmur RUSB.  ABD:  Soft, nontender. BS +, no masses or bruits. No hepatomegaly, no splenomegaly EXT:  2 + pulses throughout, 2+ edema pretibial- distal calf, no cyanosis no clubbing SKIN:  Warm and dry.  No rashes NEURO:  Alert and oriented x 3. Cranial nerves II through XII intact. PSYCH:  Cognitively intact      Wt Readings from Last 3 Encounters:  11/18/20 203 lb (92.1 kg)  01/02/20 208 lb (94.3 kg)  07/10/19 204 lb 3.2 oz (92.6 kg)      Studies/Labs Reviewed:   EKG:  EKG is ordered today. Sinus brady rate 54. First degree AV block. LVH with QRS widening and repolarization abnormality. Possible old septal infarct. No change from 2020. I have personally reviewed and interpreted this study.   Recent Labs: 10/31/2020: ALT 11; BUN 52; Creatinine, Ser 2.11; Potassium 5.5; Sodium 142   Lipid Panel    Component Value Date/Time   CHOL 134 10/31/2020 0834   TRIG 89 10/31/2020 0834   HDL 47 10/31/2020 0834   CHOLHDL 2.9 10/31/2020 0834   LDLCALC 70 10/31/2020 0834    Additional studies/ records that were reviewed today include:   Lipid panel in Dec. 2016 show cholesterol 149, trig- 115, HDL 45, LDL 81.  CBC and CMET normal in April 2017.   Labs dated 02/09/17: Hgb 10.2, TFTs normal. BUN 24. Creatinine 1.31. Sodium 130.  Dated 04/07/18: BUN 46, creatinine 1.71. Other chemistries normal. CBC normal. Dated 07/01/18: BUN 43, creatinine 1.88. otherwise CMET normal. Cholesterol 125, triglycerides 79, HDL 47, LDL 62. Plts. 141K, otherwise CBC normal. Dated 04/28/19: BUN 50,  creatinine 1.96, potassium 5.5.  Cholesterol 123, triglycerides 63, HDL 48, LDL 62. Uric acid 5.6. CBC normal.  Dated 11/28/19: BUN 53, creatinine 2.0. otherwise CMET normal. Cholesterol 130, triglycerides 97, HDL 45, LDL 66. CBC normal  ASSESSMENT:    1. Coronary artery disease involving native coronary artery of native heart without angina pectoris   2. Pure hypercholesterolemia   3. Essential hypertension   4. Stage 3b chronic kidney disease ([place])      PLAN:  In order of problems listed above:  1. PVCs chronic.  He is  asymptomatic. 2. Hypercholesterolemia. On statin therapy. Excellent control 3. CAD s/p CABG 2001. S/p Stenting of the LCx and OM1 in Jan 2017 with DES. On ASA only now. No anginal symptoms. He does have chronic DOE that I suspect is more related to age, debility and deconditioning.  4. Aortic stenosis. Echo showed only mild AS  5. HTN control well controlled.  6. CKD stage 3b. Last creatinine 1.99. followed by Dr Joelyn Oms. 7. LE edema- stable/improved. Stressed importance of sodium restriction, elevation. Continue current Torsemide dose.    Follow up 6 months.  Medication Adjustments/Labs and Tests Ordered: Current medicines are reviewed at length with the patient today.  Concerns regarding medicines are outlined above.  Medication changes, Labs and Tests ordered today are listed in the Patient Instructions below. There are no Patient Instructions on file for this visit.   Signed, Rhesa Forsberg Martinique, MD  11/18/2020 11:19 AM    [facility] [address] [place], [place], [address] [phone]

## 2020-11-18 ENCOUNTER — Other Ambulatory Visit: Payer: Self-pay

## 2020-11-18 ENCOUNTER — Ambulatory Visit (INDEPENDENT_AMBULATORY_CARE_PROVIDER_SITE_OTHER): Payer: Medicare Other | Admitting: Cardiology

## 2020-11-18 ENCOUNTER — Encounter: Payer: Self-pay | Admitting: Cardiology

## 2020-11-18 VITALS — BP 138/60 | HR 54 | Ht 70.0 in | Wt 203.0 lb

## 2020-11-18 DIAGNOSIS — N1832 Chronic kidney disease, stage 3b: Secondary | ICD-10-CM | POA: Diagnosis not present

## 2020-11-18 DIAGNOSIS — I10 Essential (primary) hypertension: Secondary | ICD-10-CM

## 2020-11-18 DIAGNOSIS — E7800 Pure hypercholesterolemia, unspecified: Secondary | ICD-10-CM

## 2020-11-18 DIAGNOSIS — I2510 Atherosclerotic heart disease of native coronary artery without angina pectoris: Secondary | ICD-10-CM | POA: Diagnosis not present

## 2021-07-31 DEATH — deceased

## 2021-10-14 ENCOUNTER — Ambulatory Visit: Payer: Medicare Other | Admitting: Cardiology

## 2021-10-22 ENCOUNTER — Ambulatory Visit: Payer: Medicare Other | Admitting: Cardiology
